# Patient Record
Sex: Female | Born: 1995 | Race: Asian | Marital: Married | Smoking: Never smoker
Health system: Southern US, Community
[De-identification: ages and names within clinical notes are randomized; demographics above are authoritative.]

## PROBLEM LIST (undated history)

## (undated) DIAGNOSIS — K5900 Constipation, unspecified: Secondary | ICD-10-CM

## (undated) DIAGNOSIS — M76892 Other specified enthesopathies of left lower limb, excluding foot: Secondary | ICD-10-CM

## (undated) DIAGNOSIS — T7840XA Allergy, unspecified, initial encounter: Secondary | ICD-10-CM

## (undated) HISTORY — DX: Allergy, unspecified, initial encounter: T78.40XA

## (undated) HISTORY — DX: Other specified enthesopathies of left lower limb, excluding foot: M76.892

## (undated) HISTORY — DX: Constipation, unspecified: K59.00

---

## 2015-06-30 NOTE — L&D Delivery Note (Signed)
20 y.o. G2P1001 at 3239w4d delivered by Dr. Freddrick MarchYashika Amin, a viable female infant in cephalic, LOA position. No nuchal cord. Right anterior shoulder delivered with ease. 60 sec delayed cord clamping. Cord clamped x2 and cut. Placenta delivered spontaneously intact, with 3VC. Fundus firm on exam with massage and pitocin. Good hemostasis noted.  Laceration: Deep 2nd degree perineal, EAS intact; left periurethral (when repaired, had a straight catheter in place to protect the urethra, patent urethra when finished with repair, straight foley catheter removed). Suture: 2-0 Vicryl, 3-0 Vicryl. 4-0 Monocryl Good hemostasis noted. EBL: 300 cc  Mom and baby recovering in LDR.    Apgars: 9/9 Weight: 2934g (6lb 7.5oz)    Jen MowElizabeth Mumaw, DO OB Fellow [facility], [facility] 06/25/2016, 5:48 PM

## 2015-11-06 ENCOUNTER — Ambulatory Visit (INDEPENDENT_AMBULATORY_CARE_PROVIDER_SITE_OTHER): Payer: Self-pay | Admitting: General Practice

## 2015-11-06 ENCOUNTER — Encounter: Payer: Self-pay | Admitting: Obstetrics and Gynecology

## 2015-11-06 DIAGNOSIS — Z3201 Encounter for pregnancy test, result positive: Secondary | ICD-10-CM

## 2015-11-06 LAB — POCT PREGNANCY, URINE: Preg Test, Ur: POSITIVE — AB

## 2015-11-06 NOTE — Progress Notes (Signed)
Patient here for upt today. upt +. Reports first positive home test last week. LMP 09/22/15 EDD 06/28/16. Encouraged PNV and provided new OB packet. Desires to start care here. Patient sent to front office to make new OB appt.

## 2015-12-11 ENCOUNTER — Ambulatory Visit (INDEPENDENT_AMBULATORY_CARE_PROVIDER_SITE_OTHER): Payer: Self-pay | Admitting: Family Medicine

## 2015-12-11 ENCOUNTER — Encounter: Payer: Self-pay | Admitting: Family Medicine

## 2015-12-11 VITALS — BP 121/70 | HR 67 | Ht 65.0 in | Wt 106.0 lb

## 2015-12-11 DIAGNOSIS — Z3491 Encounter for supervision of normal pregnancy, unspecified, first trimester: Secondary | ICD-10-CM

## 2015-12-11 DIAGNOSIS — Z3492 Encounter for supervision of normal pregnancy, unspecified, second trimester: Secondary | ICD-10-CM

## 2015-12-11 DIAGNOSIS — O2341 Unspecified infection of urinary tract in pregnancy, first trimester: Secondary | ICD-10-CM

## 2015-12-11 DIAGNOSIS — Z3490 Encounter for supervision of normal pregnancy, unspecified, unspecified trimester: Secondary | ICD-10-CM | POA: Insufficient documentation

## 2015-12-11 DIAGNOSIS — O2340 Unspecified infection of urinary tract in pregnancy, unspecified trimester: Secondary | ICD-10-CM | POA: Insufficient documentation

## 2015-12-11 LAB — POCT URINALYSIS DIP (DEVICE)
Bilirubin Urine: NEGATIVE
GLUCOSE, UA: NEGATIVE mg/dL
Hgb urine dipstick: NEGATIVE
Ketones, ur: NEGATIVE mg/dL
LEUKOCYTES UA: NEGATIVE
NITRITE: NEGATIVE
Protein, ur: NEGATIVE mg/dL
Specific Gravity, Urine: 1.015 (ref 1.005–1.030)
UROBILINOGEN UA: 0.2 mg/dL (ref 0.0–1.0)
pH: 7 (ref 5.0–8.0)

## 2015-12-11 NOTE — Progress Notes (Signed)
Subjective:  Erika Alvarez is a 20 y.o. G1P0 at 7787w3d being seen today for ongoing prenatal care.  She is currently monitored for the following issues for this low-risk pregnancy and has Supervision of low-risk pregnancy and UTI in pregnancy, antepartum on her problem list.  Patient reports no complaints.  Contractions: Not present. Vag. Bleeding: None.  Movement: Absent. Denies leaking of fluid.   The following portions of the patient's history were reviewed and updated as appropriate: allergies, current medications, past family history, past medical history, past social history, past surgical history and problem list. Problem list updated.  Objective:   Filed Vitals:   12/11/15 0944 12/11/15 1010  BP: 121/70   Pulse: 67   Height:  5\' 5"  (1.651 m)  Weight: 106 lb (48.081 kg)     Fetal Status:     Movement: Absent     General:  Alert, oriented and cooperative. Patient is in no acute distress.  Skin: Skin is warm and dry. No rash noted.   Cardiovascular: Normal heart rate noted  Respiratory: Normal respiratory effort, no problems with respiration noted  Abdomen: Soft, gravid, appropriate for gestational age. Pain/Pressure: Absent     Pelvic: Cervical exam deferred        Extremities: Normal range of motion.  Edema: None  Mental Status: Normal mood and affect. Normal behavior. Normal judgment and thought content.   Urinalysis:      Assessment and Plan:  Pregnancy: G1P0 at 3787w3d  1. Supervision of low-risk pregnancy, second trimester - added box - Prenatal Profile - Culture, OB Urine - GC/Chlamydia probe amp ([place] - [id number] PDM PROFILE  2. UTI in pregnancy, antepartum, first trimester Will finish keflex TOC after completion  Preterm labor symptoms and general obstetric precautions including but not limited to vaginal bleeding, contractions, leaking of fluid and fetal movement were reviewed in detail with the patient. Please refer to After Visit Summary for  other counseling recommendations.  Return in about 4 weeks (around 01/08/2016) for Routine prenatal care.   Federico FlakeKimberly Niles Haizlee Henton, MD

## 2015-12-11 NOTE — Patient Instructions (Signed)
First Trimester of Pregnancy The first trimester of pregnancy is from week 1 until the end of week 12 (months 1 through 3). A week after a sperm fertilizes an egg, the egg will implant on the wall of the uterus. This embryo will begin to develop into a baby. Genes from you and your partner are forming the baby. The female genes determine whether the baby is a boy or a girl. At 6-8 weeks, the eyes and face are formed, and the heartbeat can be seen on ultrasound. At the end of 12 weeks, all the baby's organs are formed.  Now that you are pregnant, you will want to do everything you can to have a healthy baby. Two of the most important things are to get good prenatal care and to follow your health care provider's instructions. Prenatal care is all the medical care you receive before the baby's birth. This care will help prevent, find, and treat any problems during the pregnancy and childbirth. BODY CHANGES Your body goes through many changes during pregnancy. The changes vary from woman to woman.   You may gain or lose a couple of pounds at first.  You may feel sick to your stomach (nauseous) and throw up (vomit). If the vomiting is uncontrollable, call your health care provider.  You may tire easily.  You may develop headaches that can be relieved by medicines approved by your health care provider.  You may urinate more often. Painful urination may mean you have a bladder infection.  You may develop heartburn as a result of your pregnancy.  You may develop constipation because certain hormones are causing the muscles that push waste through your intestines to slow down.  You may develop hemorrhoids or swollen, bulging veins (varicose veins).  Your breasts may begin to grow larger and become tender. Your nipples may stick out more, and the tissue that surrounds them (areola) may become darker.  Your gums may bleed and may be sensitive to brushing and flossing.  Dark spots or blotches (chloasma,  mask of pregnancy) may develop on your face. This will likely fade after the baby is born.  Your menstrual periods will stop.  You may have a loss of appetite.  You may develop cravings for certain kinds of food.  You may have changes in your emotions from day to day, such as being excited to be pregnant or being concerned that something may go wrong with the pregnancy and baby.  You may have more vivid and strange dreams.  You may have changes in your hair. These can include thickening of your hair, rapid growth, and changes in texture. Some women also have hair loss during or after pregnancy, or hair that feels dry or thin. Your hair will most likely return to normal after your baby is born. WHAT TO EXPECT AT YOUR PRENATAL VISITS During a routine prenatal visit:  You will be weighed to make sure you and the baby are growing normally.  Your blood pressure will be taken.  Your abdomen will be measured to track your baby's growth.  The fetal heartbeat will be listened to starting around week 10 or 12 of your pregnancy.  Test results from any previous visits will be discussed. Your health care provider may ask you:  How you are feeling.  If you are feeling the baby move.  If you have had any abnormal symptoms, such as leaking fluid, bleeding, severe headaches, or abdominal cramping.  If you are using any tobacco products,   including cigarettes, chewing tobacco, and electronic cigarettes.  If you have any questions. Other tests that may be performed during your first trimester include:  Blood tests to find your blood type and to check for the presence of any previous infections. They will also be used to check for low iron levels (anemia) and Rh antibodies. Later in the pregnancy, blood tests for diabetes will be done along with other tests if problems develop.  Urine tests to check for infections, diabetes, or protein in the urine.  An ultrasound to confirm the proper growth  and development of the baby.  An amniocentesis to check for possible genetic problems.  Fetal screens for spina bifida and Down syndrome.  You may need other tests to make sure you and the baby are doing well.  HIV (human immunodeficiency virus) testing. Routine prenatal testing includes screening for HIV, unless you choose not to have this test. HOME CARE INSTRUCTIONS  Medicines  Follow your health care provider's instructions regarding medicine use. Specific medicines may be either safe or unsafe to take during pregnancy.  Take your prenatal vitamins as directed.  If you develop constipation, try taking a stool softener if your health care provider approves. Diet  Eat regular, well-balanced meals. Choose a variety of foods, such as meat or vegetable-based protein, fish, milk and low-fat dairy products, vegetables, fruits, and whole grain breads and cereals. Your health care provider will help you determine the amount of weight gain that is right for you.  Avoid raw meat and uncooked cheese. These carry germs that can cause birth defects in the baby.  Eating four or five small meals rather than three large meals a day may help relieve nausea and vomiting. If you start to feel nauseous, eating a few soda crackers can be helpful. Drinking liquids between meals instead of during meals also seems to help nausea and vomiting.  If you develop constipation, eat more high-fiber foods, such as fresh vegetables or fruit and whole grains. Drink enough fluids to keep your urine clear or pale yellow. Activity and Exercise  Exercise only as directed by your health care provider. Exercising will help you:  Control your weight.  Stay in shape.  Be prepared for labor and delivery.  Experiencing pain or cramping in the lower abdomen or low back is a good sign that you should stop exercising. Check with your health care provider before continuing normal exercises.  Try to avoid standing for long  periods of time. Move your legs often if you must stand in one place for a long time.  Avoid heavy lifting.  Wear low-heeled shoes, and practice good posture.  You may continue to have sex unless your health care provider directs you otherwise. Relief of Pain or Discomfort  Wear a good support bra for breast tenderness.   Take warm sitz baths to soothe any pain or discomfort caused by hemorrhoids. Use hemorrhoid cream if your health care provider approves.   Rest with your legs elevated if you have leg cramps or low back pain.  If you develop varicose veins in your legs, wear support hose. Elevate your feet for 15 minutes, 3-4 times a day. Limit salt in your diet. Prenatal Care  Schedule your prenatal visits by the twelfth week of pregnancy. They are usually scheduled monthly at first, then more often in the last 2 months before delivery.  Write down your questions. Take them to your prenatal visits.  Keep all your prenatal visits as directed by your   health care provider. Safety  Wear your seat belt at all times when driving.  Make a list of emergency phone numbers, including numbers for family, friends, the hospital, and police and fire departments. General Tips  Ask your health care provider for a referral to a local prenatal education class. Begin classes no later than at the beginning of month 6 of your pregnancy.  Ask for help if you have counseling or nutritional needs during pregnancy. Your health care provider can offer advice or refer you to specialists for help with various needs.  Do not use hot tubs, steam rooms, or saunas.  Do not douche or use tampons or scented sanitary pads.  Do not cross your legs for long periods of time.  Avoid cat litter boxes and soil used by cats. These carry germs that can cause birth defects in the baby and possibly loss of the fetus by miscarriage or stillbirth.  Avoid all smoking, herbs, alcohol, and medicines not prescribed by  your health care provider. Chemicals in these affect the formation and growth of the baby.  Do not use any tobacco products, including cigarettes, chewing tobacco, and electronic cigarettes. If you need help quitting, ask your health care provider. You may receive counseling support and other resources to help you quit.  Schedule a dentist appointment. At home, brush your teeth with a soft toothbrush and be gentle when you floss. SEEK MEDICAL CARE IF:   You have dizziness.  You have mild pelvic cramps, pelvic pressure, or nagging pain in the abdominal area.  You have persistent nausea, vomiting, or diarrhea.  You have a bad smelling vaginal discharge.  You have pain with urination.  You notice increased swelling in your face, hands, legs, or ankles. SEEK IMMEDIATE MEDICAL CARE IF:   You have a fever.  You are leaking fluid from your vagina.  You have spotting or bleeding from your vagina.  You have severe abdominal cramping or pain.  You have rapid weight gain or loss.  You vomit blood or material that looks like coffee grounds.  You are exposed to German measles and have never had them.  You are exposed to fifth disease or chickenpox.  You develop a severe headache.  You have shortness of breath.  You have any kind of trauma, such as from a fall or a car accident.   This information is not intended to replace advice given to you by your health care provider. Make sure you discuss any questions you have with your health care provider.   Document Released: 06/09/2001 Document Revised: 07/06/2014 Document Reviewed: 04/25/2013 Elsevier Interactive Patient Education 2016 Elsevier Inc.  

## 2015-12-11 NOTE — Progress Notes (Signed)
Video interpreters used for encounter Greig Castilla- Andrew # [phone] and Mai # [phone].  [facility], also treated for UTI.

## 2015-12-12 LAB — PRENATAL PROFILE (SOLSTAS)
Antibody Screen: NEGATIVE
BASOS ABS: 0 {cells}/uL (ref 0–200)
Basophils Relative: 0 %
Eosinophils Absolute: 192 cells/uL (ref 15–500)
Eosinophils Relative: 3 %
HEMATOCRIT: 38.2 % (ref 35.0–45.0)
HEP B S AG: NEGATIVE
HIV: NONREACTIVE
Hemoglobin: 12.4 g/dL (ref 11.7–15.5)
LYMPHS ABS: 1408 {cells}/uL (ref 850–3900)
Lymphocytes Relative: 22 %
MCH: 29.2 pg (ref 27.0–33.0)
MCHC: 32.5 g/dL (ref 32.0–36.0)
MCV: 90.1 fL (ref 80.0–100.0)
MONO ABS: 256 {cells}/uL (ref 200–950)
MPV: 10.7 fL (ref 7.5–12.5)
Monocytes Relative: 4 %
NEUTROS ABS: 4544 {cells}/uL (ref 1500–7800)
NEUTROS PCT: 71 %
Platelets: 175 10*3/uL (ref 140–400)
RBC: 4.24 MIL/uL (ref 3.80–5.10)
RDW: 14.5 % (ref 11.0–15.0)
RUBELLA: 3.05 {index} — AB (ref <0.90)
Rh Type: POSITIVE
WBC: 6.4 10*3/uL (ref 3.8–10.8)

## 2015-12-12 LAB — GC/CHLAMYDIA PROBE AMP (~~LOC~~) NOT AT ARMC
Chlamydia: NEGATIVE
NEISSERIA GONORRHEA: NEGATIVE

## 2015-12-18 ENCOUNTER — Encounter: Payer: Self-pay | Admitting: Obstetrics and Gynecology

## 2015-12-18 DIAGNOSIS — IMO0002 Reserved for concepts with insufficient information to code with codable children: Secondary | ICD-10-CM | POA: Insufficient documentation

## 2016-01-16 ENCOUNTER — Ambulatory Visit (INDEPENDENT_AMBULATORY_CARE_PROVIDER_SITE_OTHER): Payer: Self-pay | Admitting: Family

## 2016-01-16 ENCOUNTER — Encounter (HOSPITAL_COMMUNITY): Payer: Self-pay | Admitting: Family Medicine

## 2016-01-16 VITALS — BP 117/63 | HR 86 | Wt 110.4 lb

## 2016-01-16 DIAGNOSIS — Z3492 Encounter for supervision of normal pregnancy, unspecified, second trimester: Secondary | ICD-10-CM

## 2016-01-16 LAB — POCT URINALYSIS DIP (DEVICE)
Bilirubin Urine: NEGATIVE
GLUCOSE, UA: NEGATIVE mg/dL
HGB URINE DIPSTICK: NEGATIVE
KETONES UR: NEGATIVE mg/dL
LEUKOCYTES UA: NEGATIVE
Nitrite: NEGATIVE
PROTEIN: NEGATIVE mg/dL
SPECIFIC GRAVITY, URINE: 1.025 (ref 1.005–1.030)
Urobilinogen, UA: 0.2 mg/dL (ref 0.0–1.0)
pH: 6.5 (ref 5.0–8.0)

## 2016-01-16 NOTE — Progress Notes (Signed)
[place] interpreter Angie [phone] used

## 2016-01-16 NOTE — Progress Notes (Signed)
Subjective:  Erika Alvarez is a 20 y.o. G1P0 at 386w4d being seen today for ongoing prenatal care.  She is currently monitored for the following issues for this low-risk pregnancy and has Supervision of low-risk pregnancy; UTI in pregnancy, antepartum; and Teen pregnancy on her problem list.  Patient reports no complaints.  Contractions: Not present. Vag. Bleeding: None.  Movement: Present. Denies leaking of fluid.   The following portions of the patient's history were reviewed and updated as appropriate: allergies, current medications, past family history, past medical history, past social history, past surgical history and problem list. Problem list updated.  Objective:   Filed Vitals:   01/16/16 1248  BP: 117/63  Pulse: 86  Weight: 110 lb 6.4 oz (50.077 kg)    Fetal Status: Fetal Heart Rate (bpm): 150 Fundal Height: 17 cm Movement: Present     General:  Alert, oriented and cooperative. Patient is in no acute distress.  Skin: Skin is warm and dry. No rash noted.   Cardiovascular: Normal heart rate noted  Respiratory: Normal respiratory effort, no problems with respiration noted  Abdomen: Soft, gravid, appropriate for gestational age. Pain/Pressure: Present     Pelvic:  Cervical exam deferred        Extremities: Normal range of motion.  Edema: None  Mental Status: Normal mood and affect. Normal behavior. Normal judgment and thought content.   Urinalysis:     Protein negative  Glucose negative  Assessment and Plan:  Pregnancy: G1P0 at 326w4d  1. Supervision of low-risk pregnancy, second trimester - Reviewed lab results - Anatomy ultrasound scheduled for 01/23/16  Preterm labor symptoms and general obstetric precautions including but not limited to vaginal bleeding, contractions, leaking of fluid and fetal movement were reviewed in detail with the patient. Please refer to After Visit Summary for other counseling recommendations.  Return in about 4 weeks (around  02/13/2016).   Eino FarberWalidah Kennith GainN Karim, CNM

## 2016-01-23 ENCOUNTER — Ambulatory Visit (HOSPITAL_COMMUNITY)
Admission: RE | Admit: 2016-01-23 | Discharge: 2016-01-23 | Disposition: A | Discharge status: Home / Self Care | Payer: Medicaid Other | Source: Ambulatory Visit | Attending: Family Medicine | Admitting: Family Medicine

## 2016-01-23 ENCOUNTER — Other Ambulatory Visit: Payer: Self-pay | Admitting: Family Medicine

## 2016-01-23 ENCOUNTER — Other Ambulatory Visit (HOSPITAL_COMMUNITY): Payer: Self-pay | Admitting: Obstetrics and Gynecology

## 2016-01-23 DIAGNOSIS — Z1389 Encounter for screening for other disorder: Secondary | ICD-10-CM

## 2016-01-23 DIAGNOSIS — O350XX0 Maternal care for (suspected) central nervous system malformation in fetus, not applicable or unspecified: Secondary | ICD-10-CM

## 2016-01-23 DIAGNOSIS — Z363 Encounter for antenatal screening for malformations: Secondary | ICD-10-CM

## 2016-01-23 DIAGNOSIS — O283 Abnormal ultrasonic finding on antenatal screening of mother: Secondary | ICD-10-CM | POA: Insufficient documentation

## 2016-01-23 DIAGNOSIS — Z3492 Encounter for supervision of normal pregnancy, unspecified, second trimester: Secondary | ICD-10-CM

## 2016-01-23 DIAGNOSIS — IMO0002 Reserved for concepts with insufficient information to code with codable children: Secondary | ICD-10-CM | POA: Insufficient documentation

## 2016-01-23 DIAGNOSIS — O359XX0 Maternal care for (suspected) fetal abnormality and damage, unspecified, not applicable or unspecified: Secondary | ICD-10-CM

## 2016-01-23 DIAGNOSIS — Z3A17 17 weeks gestation of pregnancy: Secondary | ICD-10-CM | POA: Diagnosis not present

## 2016-01-23 DIAGNOSIS — Z315 Encounter for genetic counseling: Secondary | ICD-10-CM | POA: Diagnosis present

## 2016-01-23 NOTE — Progress Notes (Signed)
Genetic Counseling  High-Risk Gestation Note  Appointment Date:  01/23/2016 Referred By: Federico Flake,* Date of Birth:  June 05, 1996   Pregnancy History: G1P0 Estimated Date of Delivery: 06/28/16 Estimated Gestational Age: [redacted]w[redacted]d Attending: Eulis Foster, MD   [name] and her partner were seen for genetic counseling because of abnormal ultrasound findings today.  Vietnamese/English [employer] interpreter, Vivi Barrack [phone]), provided interpretation for today's visit.   In summary:  Reviewed ultrasound findings and the association with fetal aneuploidy  Choroid plexus cyst  Echogenic intracardiac focus  Given patient's age, these findings are expected to confer an overall low risk for fetal aneuploidy for the pregnancy  Discussed screening for fetal aneuploidy  Quad screen- declined  NIPS- declined  Discussed option of diagnostic testing  Amniocentesis- declined  Follow-up ultrasound is scheduled for 02/20/16  Ultrasound revealed an echogenic intracardiac focus (EIF) and choroid plexus cysts (CPC).  The ultrasound report will be sent under separate cover.  We discussed that the second trimester genetic sonogram is targeted at identifying features associated with aneuploidy.  It has evolved as a screening tool used to provide an individualized risk assessment for Down syndrome and other trisomies.  The ability of sonography to aid in the detection of aneuploidies relies on identification of both major structural anomalies and "soft markers."  The patient was counseled that the latter term refers to findings that are often normal variants and do not cause any significant medical problems.  Nonetheless, these markers have a known association with aneuploidy.    The patient was counseled that an EIF is characterized by calcified papillary muscle leading to a discreet dot in the left, or less commonly, the right ventricle.  We discussed that this finding is typically  considered to be a benign variant; however, the risk of aneuploidy is increased when this marker is found in patients who have additional risk factors for fetal aneuploidy (AMA, abnormal screening test, or other markers or anomalies by fetal ultrasound).   Ms. Erika Alvarez was counseled that the choroid plexus is an area in the brain where cerebral spinal fluid, the fluid that bathes the brain and spinal cord, is made.  Cysts, or fluid filled sacs, are sometimes found in the choroid plexus of babies both before and after they are born.  We discussed that approximately 1% of pregnancies evaluated by ultrasound will show choroid plexus cyst (CPCs).  Literature suggests that CPCs are an ultrasound finding in approximately 30-50% of fetuses with trisomy 18, but are an isolated finding in less than 10% of fetuses with trisomy 20.  They were counseled that when a patient has other risk factors for fetal trisomy 33 (abnormal First trimester or quad screening, advanced maternal age, or another ultrasound finding), CPCs are associated with an increased risk (LR of 9) for trisomy 63.  Newer literature suggests that in the absence of other risk factors, CPCs are likely a normal variation of development or a benign finding.  CPCs are not associated with an increased risk for fetal Down syndrome.  We reviewed chromosomes, nondisjunction, and the common features and variable prognosis of Down syndrome and poor prognosis of trisomy 18.  Considering her maternal age of 20 y.o. and her otherwise normal fetal anatomy ultrasound, Ms. Erika Alvarez's risk for fetal trisomy 34 is not expected to be significantly increased above her age related risk, and risk for fetal Down syndrome is expected to be less than 1 in 600.  However, additional screening and diagnostic testing options  for detection of fetal aneuploidy were discussed.     We reviewed other available screening options including Quad screen and noninvasive prenatal screening  (NIPS)/cell free DNA (cfDNA) screening. We discussed that a screening test adjusts the a priori risk to provide a pregnancy specific risk assessment. We reviewed the sensitivities and false positive rates of each screen as well as the methodology of each. We discussed the diagnostic testing option of amniocentesis including the associated 1 in 300-500 risk for complications, including spontaneous pregnancy loss.  She was counseled regarding the benefits and limitations of each option.  We discussed the possible results that the tests might provide including: positive, negative, unanticipated, and no result. Finally, they were counseled regarding the cost of each option and potential out of pocket expenses.   After consideration of all the options, they declined Quad screen, NIPS, and amniocentesis. She elected to return for ultrasound only, which is scheduled for 02/20/16.   Family and pregnancy histories were not reviewed today given the add-on nature and time constraints of today's visit.   I counseled this couple regarding the above risks and available options.  The approximate face-to-face time with the genetic counselor was 40 minutes.   Quinn Plowman, MS Certified Genetic Counselor 01/23/2016

## 2016-02-17 ENCOUNTER — Ambulatory Visit (INDEPENDENT_AMBULATORY_CARE_PROVIDER_SITE_OTHER): Payer: Medicaid Other | Admitting: Obstetrics and Gynecology

## 2016-02-17 VITALS — BP 116/71 | HR 88 | Wt 118.8 lb

## 2016-02-17 DIAGNOSIS — Z3492 Encounter for supervision of normal pregnancy, unspecified, second trimester: Secondary | ICD-10-CM

## 2016-02-17 DIAGNOSIS — O283 Abnormal ultrasonic finding on antenatal screening of mother: Secondary | ICD-10-CM

## 2016-02-17 DIAGNOSIS — O350XX0 Maternal care for (suspected) central nervous system malformation in fetus, not applicable or unspecified: Secondary | ICD-10-CM

## 2016-02-17 DIAGNOSIS — IMO0002 Reserved for concepts with insufficient information to code with codable children: Secondary | ICD-10-CM

## 2016-02-17 LAB — POCT URINALYSIS DIP (DEVICE)
BILIRUBIN URINE: NEGATIVE
GLUCOSE, UA: NEGATIVE mg/dL
Hgb urine dipstick: NEGATIVE
KETONES UR: NEGATIVE mg/dL
Nitrite: NEGATIVE
PROTEIN: NEGATIVE mg/dL
SPECIFIC GRAVITY, URINE: 1.02 (ref 1.005–1.030)
Urobilinogen, UA: 0.2 mg/dL (ref 0.0–1.0)
pH: 6 (ref 5.0–8.0)

## 2016-02-17 NOTE — Progress Notes (Signed)
Used [employer] interpreter [phone]#[id number].

## 2016-02-17 NOTE — Progress Notes (Signed)
Subjective:  Erika Alvarez is a 20 y.o. G1P0 at [redacted]w[redacted]d being seen today for ongoing prenatal care.  She is currently monitored for the following issues for this low-risk pregnancy and has Supervision of low-risk pregnancy; Teen pregnancy; Choroid plexus cyst of fetus on prenatal ultrasound; and Echogenic intracardiac focus of fetus on prenatal ultrasound on her problem list.  Patient reports no complaints.  Contractions: Not present. Vag. Bleeding: None.  Movement: Present. Denies leaking of fluid.   The following portions of the patient's history were reviewed and updated as appropriate: allergies, current medications, past family history, past medical history, past social history, past surgical history and problem list. Problem list updated.  Objective:   Vitals:   02/17/16 1256  BP: 116/71  Pulse: 88  Weight: 118 lb 12.8 oz (53.9 kg)    Fetal Status: Fetal Heart Rate (bpm): 154   Movement: Present     General:  Alert, oriented and cooperative. Patient is in no acute distress.  Skin: Skin is warm and dry. No rash noted.   Cardiovascular: Normal heart rate noted  Respiratory: Normal respiratory effort, no problems with respiration noted  Abdomen: Soft, gravid, appropriate for gestational age. Pain/Pressure: Present     Pelvic:  Cervical exam deferred        Extremities: Normal range of motion.  Edema: None  Mental Status: Normal mood and affect. Normal behavior. Normal judgment and thought content.   Urinalysis:      Assessment and Plan:  Pregnancy: G1P0 at [redacted]w[redacted]d  1. Supervision of low-risk pregnancy, second trimester  - Culture, OB Urine  2. Teen pregnancy   3. Choroid plexus cyst of fetus on prenatal ultrasound  Has F/U U/S this week  4. Echogenic intracardiac focus of fetus on prenatal ultrasound  Has F/U U/S this week  Preterm labor symptoms and general obstetric precautions including but not limited to vaginal bleeding, contractions, leaking of fluid and fetal  movement were reviewed in detail with the patient. Please refer to After Visit Summary for other counseling recommendations.  Return in about 4 weeks (around 03/16/2016) for OB visit.   Hermina StaggersMichael L Ervin, MD

## 2016-02-19 LAB — CULTURE, OB URINE
Colony Count: NO GROWTH
Organism ID, Bacteria: NO GROWTH

## 2016-02-20 ENCOUNTER — Encounter (HOSPITAL_COMMUNITY): Payer: Self-pay

## 2016-02-20 ENCOUNTER — Ambulatory Visit (HOSPITAL_COMMUNITY)
Admission: RE | Admit: 2016-02-20 | Discharge: 2016-02-20 | Disposition: A | Discharge status: Home / Self Care | Payer: Medicaid Other | Source: Ambulatory Visit | Attending: Family Medicine | Admitting: Family Medicine

## 2016-02-20 DIAGNOSIS — Z3A21 21 weeks gestation of pregnancy: Secondary | ICD-10-CM | POA: Insufficient documentation

## 2016-02-20 DIAGNOSIS — O350XX0 Maternal care for (suspected) central nervous system malformation in fetus, not applicable or unspecified: Secondary | ICD-10-CM

## 2016-02-20 DIAGNOSIS — O358XX0 Maternal care for other (suspected) fetal abnormality and damage, not applicable or unspecified: Secondary | ICD-10-CM | POA: Diagnosis not present

## 2016-02-20 DIAGNOSIS — O283 Abnormal ultrasonic finding on antenatal screening of mother: Secondary | ICD-10-CM

## 2016-02-20 DIAGNOSIS — IMO0002 Reserved for concepts with insufficient information to code with codable children: Secondary | ICD-10-CM

## 2016-03-16 ENCOUNTER — Ambulatory Visit (INDEPENDENT_AMBULATORY_CARE_PROVIDER_SITE_OTHER): Payer: Medicaid Other | Admitting: Obstetrics and Gynecology

## 2016-03-16 ENCOUNTER — Encounter: Payer: Self-pay | Admitting: Obstetrics and Gynecology

## 2016-03-16 VITALS — BP 125/66 | HR 88 | Wt 123.2 lb

## 2016-03-16 DIAGNOSIS — IMO0002 Reserved for concepts with insufficient information to code with codable children: Secondary | ICD-10-CM

## 2016-03-16 DIAGNOSIS — Z3492 Encounter for supervision of normal pregnancy, unspecified, second trimester: Secondary | ICD-10-CM | POA: Diagnosis not present

## 2016-03-16 DIAGNOSIS — Z23 Encounter for immunization: Secondary | ICD-10-CM

## 2016-03-16 DIAGNOSIS — O283 Abnormal ultrasonic finding on antenatal screening of mother: Secondary | ICD-10-CM | POA: Diagnosis not present

## 2016-03-16 LAB — POCT URINALYSIS DIP (DEVICE)
Bilirubin Urine: NEGATIVE
Glucose, UA: NEGATIVE mg/dL
HGB URINE DIPSTICK: NEGATIVE
Ketones, ur: NEGATIVE mg/dL
Leukocytes, UA: NEGATIVE
NITRITE: NEGATIVE
PH: 7 (ref 5.0–8.0)
Protein, ur: NEGATIVE mg/dL
Specific Gravity, Urine: 1.015 (ref 1.005–1.030)
UROBILINOGEN UA: 0.2 mg/dL (ref 0.0–1.0)

## 2016-03-16 NOTE — Progress Notes (Signed)
Used Video interpreter [employer] [id number].

## 2016-03-16 NOTE — Addendum Note (Signed)
Addended by: Gerome ApleyZEYFANG, LINDA L on: 03/16/2016 04:21 PM   Modules accepted: Orders

## 2016-03-16 NOTE — Progress Notes (Signed)
Subjective:  Erika Alvarez is a 20 y.o. G1P0 at 4370w1d being seen today for ongoing prenatal care.  She is currently monitored for the following issues for this low-risk pregnancy and has Supervision of low-risk pregnancy; Teen pregnancy; and Echogenic intracardiac focus of fetus on prenatal ultrasound on her problem list.  Patient reports no complaints.  Contractions: Not present. Vag. Bleeding: None.  Movement: Present. Denies leaking of fluid.   The following portions of the patient's history were reviewed and updated as appropriate: allergies, current medications, past family history, past medical history, past social history, past surgical history and problem list. Problem list updated.  Objective:   Vitals:   03/16/16 1528  BP: 125/66  Pulse: 88  Weight: 123 lb 3.2 oz (55.9 kg)    Fetal Status: Fetal Heart Rate (bpm): 150   Movement: Present     General:  Alert, oriented and cooperative. Patient is in no acute distress.  Skin: Skin is warm and dry. No rash noted.   Cardiovascular: Normal heart rate noted  Respiratory: Normal respiratory effort, no problems with respiration noted  Abdomen: Soft, gravid, appropriate for gestational age. Pain/Pressure: Absent     Pelvic:  Cervical exam deferred        Extremities: Normal range of motion.  Edema: None  Mental Status: Normal mood and affect. Normal behavior. Normal judgment and thought content.   Urinalysis: Urine Protein: Negative Urine Glucose: Negative  Assessment and Plan:  Pregnancy: G1P0 at 5770w1d  1. Supervision of low-risk pregnancy, second trimester Flu vaccine  2. Teen pregnancy   3. Echogenic intracardiac focus of fetus on prenatal ultrasound Stable on last U/S. No follow recommended by MFM at last U/S.  Preterm labor symptoms and general obstetric precautions including but not limited to vaginal bleeding, contractions, leaking of fluid and fetal movement were reviewed in detail with the patient. Please refer to  After Visit Summary for other counseling recommendations.  Return in about 3 weeks (around 04/06/2016) for OB visit.   Hermina StaggersMichael L Scotland Dost, MD

## 2016-04-07 ENCOUNTER — Ambulatory Visit (INDEPENDENT_AMBULATORY_CARE_PROVIDER_SITE_OTHER): Payer: Medicaid Other | Admitting: Obstetrics & Gynecology

## 2016-04-07 ENCOUNTER — Encounter: Payer: Self-pay | Admitting: Obstetrics & Gynecology

## 2016-04-07 VITALS — BP 121/72 | HR 94 | Wt 127.0 lb

## 2016-04-07 DIAGNOSIS — Z3493 Encounter for supervision of normal pregnancy, unspecified, third trimester: Secondary | ICD-10-CM | POA: Diagnosis not present

## 2016-04-07 DIAGNOSIS — Z23 Encounter for immunization: Secondary | ICD-10-CM

## 2016-04-07 LAB — CBC
HEMATOCRIT: 34.8 % — AB (ref 35.0–45.0)
HEMOGLOBIN: 11.4 g/dL — AB (ref 11.7–15.5)
MCH: 29.8 pg (ref 27.0–33.0)
MCHC: 32.8 g/dL (ref 32.0–36.0)
MCV: 91.1 fL (ref 80.0–100.0)
MPV: 11.1 fL (ref 7.5–12.5)
Platelets: 200 10*3/uL (ref 140–400)
RBC: 3.82 MIL/uL (ref 3.80–5.10)
RDW: 13.9 % (ref 11.0–15.0)
WBC: 8 10*3/uL (ref 3.8–10.8)

## 2016-04-07 MED ORDER — TETANUS-DIPHTH-ACELL PERTUSSIS 5-2.5-18.5 LF-MCG/0.5 IM SUSP
0.5000 mL | Freq: Once | INTRAMUSCULAR | Status: AC
  Administered 2016-04-07: 0.5 mL via INTRAMUSCULAR

## 2016-04-07 NOTE — Progress Notes (Signed)
1 hour gtt due at 2:04 pm  

## 2016-04-07 NOTE — Patient Instructions (Addendum)
Return to clinic for any scheduled appointments or obstetric concerns, or go to [facility] for evaluation  

## 2016-04-07 NOTE — Progress Notes (Signed)
   PRENATAL VISIT NOTE  Subjective:  Erika Alvarez is a 20 y.o. G2P0 at 3649w2d being seen today for ongoing prenatal care.  She is currently monitored for the following issues for this low-risk pregnancy and has Supervision of low-risk pregnancy; Teen pregnancy; and Echogenic intracardiac focus of fetus on prenatal ultrasound on her problem list.  Patient reports no complaints.   .  .  Movement: Present. Denies leaking of fluid.   The following portions of the patient's history were reviewed and updated as appropriate: allergies, current medications, past family history, past medical history, past social history, past surgical history and problem list. Problem list updated.  Objective:   Vitals:   04/07/16 1259  BP: 121/72  Pulse: 94  Weight: 127 lb (57.6 kg)    Fetal Status: Fetal Heart Rate (bpm): 148 Fundal Height: 28 cm Movement: Present     General:  Alert, oriented and cooperative. Patient is in no acute distress.  Skin: Skin is warm and dry. No rash noted.   Cardiovascular: Normal heart rate noted  Respiratory: Normal respiratory effort, no problems with respiration noted  Abdomen: Soft, gravid, appropriate for gestational age. Pain/Pressure: Present     Pelvic:  Cervical exam deferred        Extremities: Normal range of motion.  Edema: None  Mental Status: Normal mood and affect. Normal behavior. Normal judgment and thought content.   Urinalysis:      Assessment and Plan:  Pregnancy: G2P0 at 4649w2d  1. Need for Tdap vaccination - Tdap vaccine greater than or equal to 7yo IM given  2. Encounter for supervision of low-risk pregnancy in third trimester Third trimester labs today - Glucose Tolerance, 1 HR (50g) - CBC - HIV antibody - RPR Preterm labor symptoms and general obstetric precautions including but not limited to vaginal bleeding, contractions, leaking of fluid and fetal movement were reviewed in detail with the patient. Please refer to After Visit Summary for  other counseling recommendations.  Return in about 2 weeks (around 04/21/2016) for OB Visit.  Tereso NewcomerUgonna A Khani Paino, MD

## 2016-04-07 NOTE — Addendum Note (Signed)
Addended by: Jill SideAY, DIANE L on: 04/07/2016 02:09 PM   Modules accepted: Orders

## 2016-04-08 LAB — GLUCOSE TOLERANCE, 1 HOUR (50G) W/O FASTING: Glucose, 1 Hr, gestational: 72 mg/dL (ref <140)

## 2016-04-08 LAB — HIV ANTIBODY (ROUTINE TESTING W REFLEX): HIV 1&2 Ab, 4th Generation: NONREACTIVE

## 2016-04-08 LAB — RPR

## 2016-04-29 ENCOUNTER — Ambulatory Visit (INDEPENDENT_AMBULATORY_CARE_PROVIDER_SITE_OTHER): Payer: Medicaid Other | Admitting: Obstetrics & Gynecology

## 2016-04-29 VITALS — BP 117/71 | HR 89 | Wt 133.0 lb

## 2016-04-29 DIAGNOSIS — Z3493 Encounter for supervision of normal pregnancy, unspecified, third trimester: Secondary | ICD-10-CM

## 2016-04-29 NOTE — Progress Notes (Signed)
   PRENATAL VISIT NOTE  Subjective:  Erika Alvarez is a 20 y.o. G2P0 at 8767w3d being seen today for ongoing prenatal care.  She is currently monitored for the following issues for this low-risk pregnancy and has Supervision of low-risk pregnancy; Teen pregnancy; and Echogenic intracardiac focus of fetus on prenatal ultrasound on her problem list. [place] interpreter used during encounter.  Patient reports no complaints.  Contractions: Irritability. Vag. Bleeding: None.  Movement: Present. Denies leaking of fluid.   The following portions of the patient's history were reviewed and updated as appropriate: allergies, current medications, past family history, past medical history, past social history, past surgical history and problem list. Problem list updated.  Objective:   Vitals:   04/29/16 1336  BP: 117/71  Pulse: 89  Weight: 133 lb (60.3 kg)    Fetal Status: Fetal Heart Rate (bpm): 150 Fundal Height: 30 cm Movement: Present     General:  Alert, oriented and cooperative. Patient is in no acute distress.  Skin: Skin is warm and dry. No rash noted.   Cardiovascular: Normal heart rate noted  Respiratory: Normal respiratory effort, no problems with respiration noted  Abdomen: Soft, gravid, appropriate for gestational age. Pain/Pressure: Absent     Pelvic:  Cervical exam deferred        Extremities: Normal range of motion.  Edema: None  Mental Status: Normal mood and affect. Normal behavior. Normal judgment and thought content.   Assessment and Plan:  Pregnancy: G2P0 at 6467w3d  1. Encounter for supervision of low-risk pregnancy in third trimester Preterm labor symptoms and general obstetric precautions including but not limited to vaginal bleeding, contractions, leaking of fluid and fetal movement were reviewed in detail with the patient. Please refer to After Visit Summary for other counseling recommendations.  Return in about 2 weeks (around 05/13/2016) for OB Visit.  Tereso NewcomerUgonna A  Syrena Burges, MD

## 2016-04-29 NOTE — Patient Instructions (Signed)
Return to clinic for any scheduled appointments or obstetric concerns, or go to [facility] for evaluation  

## 2016-05-13 ENCOUNTER — Ambulatory Visit (INDEPENDENT_AMBULATORY_CARE_PROVIDER_SITE_OTHER): Payer: Medicaid Other | Admitting: Obstetrics & Gynecology

## 2016-05-13 VITALS — BP 122/69 | HR 91 | Wt 135.5 lb

## 2016-05-13 DIAGNOSIS — Z3493 Encounter for supervision of normal pregnancy, unspecified, third trimester: Secondary | ICD-10-CM

## 2016-05-13 NOTE — Progress Notes (Signed)
Used Video interpreter Angie [id number]. C/o contractions 5 or 6 contractions in a day spread out throughout the day.

## 2016-05-13 NOTE — Patient Instructions (Signed)
Ba thng th? th? ba c?a thai k? (Third Trimester of Pregnancy) Ba thng th? ba l t? tu?n 29 ??n tu?n 42, thng th? 7 ??n thng th? 9. Ba thng th? ba c?ng l th?i gian khi bo thai ?ang pht tri?n nhanh chng. Vo cu?i thng th? chn, bo thai di kho?ng 20 inch v n?ng kho?ng 6-10 pao. C? TH? THAY ??I C? th? c?a qu v? tr?i qua r?t nhi?u thay ??i trong th?i gian mang thai. Nh?ng thay ??i khc nhau ? cc ph? n? khc nhau.  Cn n?ng c?a qu v? s? ti?p t?c t?ng. Qu v? c th? t?ng ???c 25-35 pao (11 - 16 kg) vo cu?i thai k?.  Qu v? c th? b?t ??u c cc v?t n?t trn hng, b?ng v ng?c.  Qu v? c th? ?i ti?u th??ng xuyn h?n v bo thai di chuy?n xu?ng th?p h?n vo khung ch?u c?a qu v? v ? vo bng quang c?a qu v?.  Qu v? c th? b? ho?c ti?p t?c b? ? nng do vi?c mang thai.  Qu v? c th? b? to bn v m?t s? hoc mn nh?t ??nh ?ang lm cc c? c ch?c n?ng ??y ch?t th?i qua ???ng ru?t ho?t ??ng ch?m l?i.  Qu v? c th? b? b?nh tr? ho?c s?ng, ph?ng t?nh m?ch (gin t?nh m?ch).  Qu v? c th? ?au khung ch?u v t?ng cn v cc hoc mn mang thai lm l?ng kh?p n?i gi?a cc x??ng ? khung ch?u c?a qu v?. ?au l?ng c th? do s? c? g?ng qu s?c c?a c? b?p h? tr? t? th? c?a qu v?.  Qu v? c nh?ng thay ??i v? tc. Nh?ng thay ??i ny c th? bao g?m tc dy ln, tc m?c nhanh h?n v thay ??i v? k?t c?u. M?t s? ph? n? c?ng b? r?ng tc trong khi ho?c sau khi mang thai, ho?c tc c?m th?y kh ho?c m?ng. Tc c?a qu v? s? c nhi?u kh? n?ng s? tr? l?i bnh th??ng sau khi em b ???c sinh ra.  Ng?c qu v? s? ti?p t?c pht tri?n v nh?y c?m ?au. Ch?t d?ch mu vng c th? r? t? v c?a qu v? g?i l s?a non.  R?n c?a qu v? c th? l?i ra.  Qu v? c th? c?m th?y kh th? v t? cung c?a qu v? to ra.  Qu v? c th? nh?n th?y thai nhi "r?i xu?ng", ho?c di chuy?n xu?ng th?p h?n trong b?ng c?a qu v?.  Qu v? c th? b? ti?t d?ch nh?y c mu. ?i?u ny th??ng x?y ra m?t vi ngy ??n m?t tu?n tr??c khi b?t ??u  sinh ??.  C? t? cung c?a qu v? tr? nn m?ng v m?m (b? m?ng c? t? cung) g?n ngy d? sinh c?a qu v?. TRNG ??I ?I?U G TRONG NH?NG L?N KHM TR??C KHI SINH Qu v? s? ???c khm tr??c khi sinh 2 tu?n m?t l?n cho ??n tu?n 36. Sau ?, qu v? s? ???c khm hng tu?n tr??c khi sinh. Trong m?t l?n khm tr??c khi sinh:  Qu v? s? ???c cn n?ng ?? ??m b?o qu v? v thai nhi ?ang pht tri?n bnh th??ng.  Qu v? ???c ?o huy?t p.  Qu v? s? ???c ?o b?ng ?? theo di s? pht tri?n c?a b.  S? nghe th?y nh?p tim thai.  B?t k? k?t qu? ki?m tra no trong l?n khm tr??c ? s? ???c th?o lu?n.  Qu v? c   th? ???c ki?m tra c? t? cung g?n ngy d? sinh ?? xem c? t? cung ? m?ng ch?a. Lc ???c kho?ng 36 tu?n, chuyn gia ch?m sc y t? c?a qu v? s? ki?m tra c? t? cung c?a qu v?. Cng lc ?, chuyn gia ch?m sc y t? c?ng s? th?c hi?n ki?m tra ch?t ti?t ra c?a m m ??o. Ki?m tra ny l ?? xc ??nh xem c m?t lo?i vi khu?n, lin c?u nhm B, hay khng. Chuyn gia ch?m sc y t? c?a qu v? s? gi?i thch thm. Chuyn gia ch?m sc y t? c th? h?i qu v?:  K? ho?ch sinh c?a qu v? l g.  Qu v? c?m th?y th? no.  Qu v? c c?m th?y em b c? ??ng.  Li?u qu v? c b?t k? tri?u ch?ng b?t th??ng no, ch?ng h?n nh? r r? ch?t l?ng, ch?y mu, ?au ??u nghim tr?ng ho?c co th?t ? b?ng.  Li?u qu v? c ?ang s? d?ng b?t k? s?n ph?m thu?c l no, bao g?m thu?c l d?ng ht, thu?c l d?ng nhai v thu?c l ?i?n t?.  Li?u qu v? c b?t k? cu h?i no khng. Nh?ng ki?m tra ho?c sng l?c khc c th? ???c th?c hi?n trong k? ba thng th? hai c?a qu v? bao g?m:  Cc xt nghi?m mu ki?m tra n?ng ?? s?t th?p (thi?u mu).  Th? thai ?? ki?m tra s?c kh?e, m?c ?? ho?t ??ng v s? pht tri?n c?a thai nhi. Th? nghi?m ???c th?c hi?n n?u qu v? c m?t s? tnh tr?ng b?nh l, ho?c n?u c nh?ng v?n ?? trong qu trnh mang thai.  Xt nghi?m HIV (Vi rt suy gi?m mi?n d?ch ? ng??i). N?u qu v? c nguy c? cao, qu v? c th? ???c ki?m tra HIV  trong ba thng th? ba c?a k? mang thai. CHUY?N D? GI? Qu v? c th? c?m th?y co th?t nh?, khng ??u m d?n d?n s? h?t. Chng ???c g?i l c?n co th?t Braxton Hicks, hay chuy?n d? gi?. Co th?t c th? ko di trong nhi?u gi?, nhi?u ngy, ho?c th?m ch nhi?u tu?n tr??c khi chuy?n d? th?t. N?u c?n co th?t ??u ??n, t?ng c??ng, ho?c tr? nn ?au ??n, t?t nh?t l ph?i ??n g?p chuyn gia ch?m sc y t? ?? khm. CC D?U HI?U TR? D?  Co th?t gi?ng nh? khi c kinh nguy?t.  Cc c?n co th?t cch nhau 5 pht ho?c nhanh h?n.  Co th?t b?t ??u t? ??nh t? cung v lan xu?ng b?ng d??i v l?ng.  C?m gic p l?c ln khung ch?u t?ng ln ho?c ?au l?ng.  Ti?t d?ch nh?y c n??c ho?c mu t? m ??o. N?u qu v? c b?t k? nh?ng d?u hi?u ny tr??c tu?n 37 c?a thai k?, g?i cho chuyn gia ch?m sc y t? ngay l?p t?c. Qu v? s? c?n ??n b?nh vi?n ?? ???c ki?m tra ngay l?p t?c. H??NG D?N CH?M SC T?I NH  Trnh t?t c? thu?c l, th?o d??c, r??u v ma ty khng ???c k ??n. Cc ha ch?t ny ?nh h??ng ??n s? hnh thnh v pht tri?n c?a em b.  Khng s? d?ng b?t c? cc s?n ph?m thu?c l no, bao g?m thu?c l d?ng ht, thu?c l d?ng nhai v thu?c l ?i?n t?. N?u qu v? c?n gip ?? ?? cai thu?c, hy h?i chuyn gia ch?m sc s?c kh?e. Qu v? c th? nh?n ???c h? tr? t?   v?n v cc ngu?n h? tr? khc ?? gip qu v? b? thu?c l.  Tun th? cc ch? d?n c?a chuyn gia ch?m sc y t? v? vi?c s? d?ng thu?c. C nh?ng lo?i thu?c l an ton ho?c khng an ton trong qu trnh mang thai.  T?p th? d?c th??ng xuyn theo ch? d?n c?a chuyn gia ch?m sc y t?. B? co th?t t? cung l m?t d?u hi?u t?t ?? ng?ng t?p th? d?c.  Ti?p t?c ?n cc b?a ?n th??ng xuyn, lnh m?nh.  M?c m?t chi?c o ng?c nng ?? t?t cho v b? nh?y c?m ?au.  Khng s? d?ng b?n t?m n??c nng, phng xng h?i, ho?c phng t?m h?i.  ?eo dy an ton c?a qu v? m?i lc khi li xe.  Trnh th?t s?ng, ph mai ch?a n?u chn, h?p v? sinh c?a mo, v ??t v? sinh dnh cho mo. Nh?ng th? ny mang  m?m b?nh c th? gy d? t?t b?m sinh cho em b.  U?ng vitamin tr??c khi sinh c?a qu v?.  U?ng 1500-2000 mg canxi m?i ngy b?t ??u t? tu?n th? 20 c?a thai k? cho ??n khi sinh con.  Hy th? dng thu?c lm m?m phn (n?u chuyn gia ch?m sc y t? c?a qu v? ch?p thu?n) n?u qu v? b? to bn. ?n thm th?c ?n giu ch?t x?, nh? rau t??i ho?c tri cy v ng? c?c nguyn h?t. U?ng nhi?u n??c ?? gi? cho n??c ti?u trong ho?c vng nh?t.  T?m b?n ng?i ?? lm d?u c?n ?au ho?c s? kh ch?u do b?nh tr? gy ra. S? d?ng kem tr? n?u chuyn gia ch?m sc y t? ch?p thu?n.  N?u qu v? b? gin t?nh m?ch, hy ?eo t?t h? tr?. Nng cao chn c?a qu v? trong 15 pht, 3-4 l?n m?t ngy. H?n ch? mu?i trong ch? ?? ?n c?a qu v?.  Trnh nng v?t n?ng, hay ?i giy th?p gt, v luy?n t?p t? th? ph h?p.  Ngh? ng?i nhi?u v?i ?i chn c?a qu v? nng cao n?u qu v? b? chu?t rt chn ho?c ?au vng th?t l?ng.  ??n khm nha s? n?u qu v? ch?a ??n trong th?i gian qu v? mang thai. S? d?ng m?t bn ch?i m?m ?? ch?i r?ng c?a qu v? v hy nh? nhng khi dng ch? nha khoa.  C th? ???c ti?p t?c c quan h? tnh d?c tr? khi chuyn gia ch?m sc y t? yu c?u khc.  Khng ?i du l?ch xa tr? khi n l hon ton c?n thi?t v ch? ?i v?i s? ch?p thu?n c?a chuyn gia ch?m sc y t?.  Tham gia cc l?p h?c tr??c khi sinh ?? hi?u, th?c hnh, v ??t cu h?i v? tr? d? v sinh con.  Th?c hi?n m?t l?n th? t?i b?nh vi?n.  Chu?n b? ti ?? ?? ?i vi?n.  Chu?n b? phng cho em b.  Ti?p t?c ??n m?i l?n h?n khm tr??c khi sinh theo ch? d?n c?a chuyn gia ch?m sc y t? c?a qu v?.  ?I KHM N?U:  Qu v? khng ch?c ch?n li?u qu v? c tr? d? ho?c li?u qu v? ? v? ?i hay ch?a.  Qu v? b? hoa m?t.  Qu v? b? co th?t vng ch?u, p l?c ln vng ch?u, ?au m ? ? vng b?ng c?a qu v?.  Qu v? b? bu?n nn, nn m?a ho?c tiu ch?y lin t?c.  Qu v? c d?ch m ??o mi kh ch?u.    Qu v? b? ?au khi ?i ti?u.  NGAY L?P T?C ?I KHM N?U:  Qu v? b? s?t.  Qu  v? b? r r? d?ch t? m ??o.  Qu v? c ra ??m mu ho?c ch?y mu t? m ??o.  Qu v? b? co th?t nghim tr?ng ho?c ?au ? b?ng.  Qu v? gi?m cn ho?c t?ng cn nhanh.  Qu v? b? kh th? cng v?i ?au ng?c.  Qu v? th?y s?ng b?t ng? ho?c r?t to ? m?t, tay, m?t c chn, bn chn ho?c c?ng chn.  Qu v? khng th?y em b c? ??ng trong h?n m?t gi?.  Qu v? b? ?au ??u nghim tr?ng m khng h?t sau khi dng thu?c.  Qu v? b? thay ??i th? l?c.  Thng tin ny khng nh?m m?c ?ch thay th? cho l?i khuyn m chuyn gia ch?m sc s?c kh?e ni v?i qu v?. Hy b?o ??m qu v? ph?i th?o lu?n b?t k? v?n ?? g m qu v? c v?i chuyn gia ch?m sc s?c kh?e c?a qu v?. Document Released: 07/12/2015 Document Revised: 07/12/2015 Document Reviewed: 08/16/2012 Elsevier Interactive Patient Education  2017 Elsevier Inc.  

## 2016-05-13 NOTE — Progress Notes (Signed)
   PRENATAL VISIT NOTE  Subjective:  Erika Alvarez is a 20 y.o. G2P0 at 5131w3d being seen today for ongoing prenatal care.  She is currently monitored for the following issues for this high-risk pregnancy and has Supervision of low-risk pregnancy; Teen pregnancy; and Echogenic intracardiac focus of fetus on prenatal ultrasound on her problem list.  Patient reports occasional contractions.  Contractions: Irregular. Vag. Bleeding: None.  Movement: Present. Denies leaking of fluid.   The following portions of the patient's history were reviewed and updated as appropriate: allergies, current medications, past family history, past medical history, past social history, past surgical history and problem list. Problem list updated.  Objective:   Vitals:   05/13/16 1338  BP: 122/69  Pulse: 91  Weight: 135 lb 8 oz (61.5 kg)    Fetal Status: Fetal Heart Rate (bpm): 147 Fundal Height: 32 cm Movement: Present     General:  Alert, oriented and cooperative. Patient is in no acute distress.  Skin: Skin is warm and dry. No rash noted.   Cardiovascular: Normal heart rate noted  Respiratory: Normal respiratory effort, no problems with respiration noted  Abdomen: Soft, gravid, appropriate for gestational age. Pain/Pressure: Present     Pelvic:  Cervical exam deferred        Extremities: Normal range of motion.  Edema: None  Mental Status: Normal mood and affect. Normal behavior. Normal judgment and thought content.   Assessment and Plan:  Pregnancy: G2P0 at 1231w3d  1. Encounter for supervision of low-risk pregnancy in third trimester Doing well  Preterm labor symptoms and general obstetric precautions including but not limited to vaginal bleeding, contractions, leaking of fluid and fetal movement were reviewed in detail with the patient. Please refer to After Visit Summary for other counseling recommendations.  Return in about 2 weeks (around 05/27/2016).   Adam PhenixJames G Arnold, MD

## 2016-05-26 LAB — OB RESULTS CONSOLE GBS: GBS: NEGATIVE

## 2016-05-27 ENCOUNTER — Other Ambulatory Visit (HOSPITAL_COMMUNITY)
Admission: RE | Admit: 2016-05-27 | Discharge: 2016-05-27 | Disposition: A | Discharge status: Home / Self Care | Payer: Medicaid Other | Source: Ambulatory Visit | Attending: Obstetrics and Gynecology | Admitting: Obstetrics and Gynecology

## 2016-05-27 ENCOUNTER — Ambulatory Visit (INDEPENDENT_AMBULATORY_CARE_PROVIDER_SITE_OTHER): Payer: Medicaid Other | Admitting: Obstetrics and Gynecology

## 2016-05-27 VITALS — BP 116/74 | HR 91 | Wt 136.8 lb

## 2016-05-27 DIAGNOSIS — Z3493 Encounter for supervision of normal pregnancy, unspecified, third trimester: Secondary | ICD-10-CM

## 2016-05-27 DIAGNOSIS — Z113 Encounter for screening for infections with a predominantly sexual mode of transmission: Secondary | ICD-10-CM | POA: Insufficient documentation

## 2016-05-27 DIAGNOSIS — O283 Abnormal ultrasonic finding on antenatal screening of mother: Secondary | ICD-10-CM

## 2016-05-27 DIAGNOSIS — Z789 Other specified health status: Secondary | ICD-10-CM | POA: Insufficient documentation

## 2016-05-27 DIAGNOSIS — Z758 Other problems related to medical facilities and other health care: Secondary | ICD-10-CM | POA: Insufficient documentation

## 2016-05-27 DIAGNOSIS — Z3403 Encounter for supervision of normal first pregnancy, third trimester: Secondary | ICD-10-CM

## 2016-05-27 NOTE — Progress Notes (Signed)
[employer]preter # [phone] 36 wk cultures today

## 2016-05-27 NOTE — Progress Notes (Signed)
Prenatal Visit Note Date: 05/27/2016 Clinic: [facility]  Subjective:  Erika Alvarez is a 20 y.o. G2P0 at 7284w3d being seen today for ongoing prenatal care.  She is currently monitored for the following issues for this low-risk pregnancy and has Supervision of low-risk pregnancy; EIF and CP cysts; and Language barrier on her problem list.  Patient reports no complaints.   Contractions: Irregular. Vag. Bleeding: None.  Movement: Present. Denies leaking of fluid.   The following portions of the patient's history were reviewed and updated as appropriate: allergies, current medications, past family history, past medical history, past social history, past surgical history and problem list. Problem list updated.  Objective:   Vitals:   05/27/16 1319  BP: 116/74  Pulse: 91  Weight: 136 lb 12.8 oz (62.1 kg)    Fetal Status: Fetal Heart Rate (bpm): 152 Fundal Height: 35 cm Movement: Present  Presentation: Vertex  General:  Alert, oriented and cooperative. Patient is in no acute distress.  Skin: Skin is warm and dry. No rash noted.   Cardiovascular: Normal heart rate noted  Respiratory: Normal respiratory effort, no problems with respiration noted  Abdomen: Soft, gravid, appropriate for gestational age. Pain/Pressure: Present     Pelvic:  Cervical exam deferred        Extremities: Normal range of motion.  Edema: None  Mental Status: Normal mood and affect. Normal behavior. Normal judgment and thought content.   Urinalysis:      Assessment and Plan:  Pregnancy: G2P0 at 3684w3d  1. Supervision of low-risk first pregnancy, third trimester Routine care. D/w pt re: BC nv - Culture, beta strep (group b only) - [facility]Chlamydia [facility]  2. Language barrier Interpreter used.   Preterm labor symptoms and general obstetric precautions including but not limited to vaginal bleeding, contractions, leaking of fluid and fetal movement were reviewed in  detail with the patient. Please refer to After Visit Summary for other counseling recommendations.  Return in about 1 week (around 06/03/2016) for 7-10d rob.   [place] Bingharlie Marsean Elkhatib, MD

## 2016-05-28 LAB — GC/CHLAMYDIA PROBE AMP (~~LOC~~) NOT AT ARMC
CHLAMYDIA, DNA PROBE: NEGATIVE
Neisseria Gonorrhea: NEGATIVE

## 2016-05-29 LAB — CULTURE, BETA STREP (GROUP B ONLY)

## 2016-06-08 ENCOUNTER — Ambulatory Visit (INDEPENDENT_AMBULATORY_CARE_PROVIDER_SITE_OTHER): Payer: Medicaid Other | Admitting: Obstetrics & Gynecology

## 2016-06-08 DIAGNOSIS — Z3493 Encounter for supervision of normal pregnancy, unspecified, third trimester: Secondary | ICD-10-CM

## 2016-06-08 NOTE — Progress Notes (Signed)
   PRENATAL VISIT NOTE  Subjective:  Erika Alvarez is a 20 y.o. G2P0 at 7863w1d being seen today for ongoing prenatal care.  She is currently monitored for the following issues for this low-risk pregnancy and has Supervision of low-risk pregnancy and Language barrier on her problem list. Patient is Vietnamese-speaking only, interpreter present for this encounter.   Patient reports no complaints.  Contractions: Not present. Vag. Bleeding: None.  Movement: Present. Denies leaking of fluid.   The following portions of the patient's history were reviewed and updated as appropriate: allergies, current medications, past family history, past medical history, past social history, past surgical history and problem list. Problem list updated.  Objective:   Vitals:   06/08/16 1337  BP: 110/72  Pulse: 89  Weight: 135 lb (61.2 kg)    Fetal Status: Fetal Heart Rate (bpm): 146 Fundal Height: 36 cm Movement: Present     General:  Alert, oriented and cooperative. Patient is in no acute distress.  Skin: Skin is warm and dry. No rash noted.   Cardiovascular: Normal heart rate noted  Respiratory: Normal respiratory effort, no problems with respiration noted  Abdomen: Soft, gravid, appropriate for gestational age. Pain/Pressure: Absent     Pelvic:  Cervical exam deferred        Extremities: Normal range of motion.  Edema: None  Mental Status: Normal mood and affect. Normal behavior. Normal judgment and thought content.   Assessment and Plan:  Pregnancy: G2P0 at 7763w1d  1. Encounter for supervision of low-risk pregnancy in third trimester Negative pelvic cultures, reviewed with patient. Term labor symptoms and general obstetric precautions including but not limited to vaginal bleeding, contractions, leaking of fluid and fetal movement were reviewed in detail with the patient. Please refer to After Visit Summary for other counseling recommendations.  Return in about 1 week (around 06/15/2016) for OB  Visit.   Tereso NewcomerUgonna A Jaidynn Balster, MD

## 2016-06-08 NOTE — Patient Instructions (Signed)
Return to clinic for any scheduled appointments or obstetric concerns, or go to [facility] for evaluation  

## 2016-06-15 ENCOUNTER — Ambulatory Visit (INDEPENDENT_AMBULATORY_CARE_PROVIDER_SITE_OTHER): Payer: Medicaid Other | Admitting: Advanced Practice Midwife

## 2016-06-15 ENCOUNTER — Encounter: Payer: Self-pay | Admitting: Advanced Practice Midwife

## 2016-06-15 DIAGNOSIS — Z789 Other specified health status: Secondary | ICD-10-CM

## 2016-06-15 DIAGNOSIS — Z3483 Encounter for supervision of other normal pregnancy, third trimester: Secondary | ICD-10-CM

## 2016-06-15 NOTE — Progress Notes (Signed)
   PRENATAL VISIT NOTE  Subjective:  Erika Alvarez is a 20 y.o. G2P0 at 1490w1d being seen today for ongoing prenatal care.  She is currently monitored for the following issues for this low-risk pregnancy and has Supervision of low-risk pregnancy and Language barrier on her problem list.  Patient reports no complaints.  Contractions: Not present.  .  Movement: Present. Denies leaking of fluid.   The following portions of the patient's history were reviewed and updated as appropriate: allergies, current medications, past family history, past medical history, past social history, past surgical history and problem list. Problem list updated.  Objective:   Vitals:   06/15/16 1407  BP: 129/72  Pulse: 72  Weight: 134 lb (60.8 kg)    Fetal Status: Fetal Heart Rate (bpm): 154   Movement: Present     General:  Alert, oriented and cooperative. Patient is in no acute distress.  Skin: Skin is warm and dry. No rash noted.   Cardiovascular: Normal heart rate noted  Respiratory: Normal respiratory effort, no problems with respiration noted  Abdomen: Soft, gravid, appropriate for gestational age. Pain/Pressure: Absent     Pelvic:  Cervical exam deferred        Extremities: Normal range of motion.     Mental Status: Normal mood and affect. Normal behavior. Normal judgment and thought content.   Assessment and Plan:  Pregnancy: G2P0 at 9390w1d  There are no diagnoses linked to this encounter. Term labor symptoms and general obstetric precautions including but not limited to vaginal bleeding, contractions, leaking of fluid and fetal movement were reviewed in detail with the patient. Please refer to After Visit Summary for other counseling recommendations.  RTC 1 week Interpretor used (phone)  Aviva SignsMarie L Antonia Culbertson, CNM

## 2016-06-15 NOTE — Patient Instructions (Signed)

## 2016-06-23 ENCOUNTER — Ambulatory Visit (INDEPENDENT_AMBULATORY_CARE_PROVIDER_SITE_OTHER): Payer: Medicaid Other | Admitting: Student

## 2016-06-23 VITALS — BP 114/71 | HR 84 | Temp 98.4°F | Wt 136.0 lb

## 2016-06-23 DIAGNOSIS — Z3493 Encounter for supervision of normal pregnancy, unspecified, third trimester: Secondary | ICD-10-CM

## 2016-06-23 NOTE — Progress Notes (Signed)
   PRENATAL VISIT NOTE  Subjective:  Erika Alvarez is a 20 y.o. G2P0 at 22w2dbeing seen today for ongoing prenatal care.  She is currently monitored for the following issues for this low-risk pregnancy and has Supervision of low-risk pregnancy and Language barrier on her problem list.  Patient reports no complaints.  Contractions: Irritability. Vag. Bleeding: None.  Movement: Present. Denies leaking of fluid.   The following portions of the patient's history were reviewed and updated as appropriate: allergies, current medications, past family history, past medical history, past social history, past surgical history and problem list. Problem list updated.  Objective:   Vitals:   06/23/16 1342  BP: 114/71  Pulse: 84  Temp: 98.4 F (36.9 C)  Weight: 136 lb (61.7 kg)    Fetal Status: Fetal Heart Rate (bpm): 138 Fundal Height: 37 cm Movement: Present  Presentation: Vertex  General:  Alert, oriented and cooperative. Patient is in no acute distress.  Skin: Skin is warm and dry. No rash noted.   Cardiovascular: Normal heart rate noted  Respiratory: Normal respiratory effort, no problems with respiration noted  Abdomen: Soft, gravid, appropriate for gestational age. Pain/Pressure: Present     Pelvic:  Cervical exam performed Dilation: Closed Effacement (%): 50    Extremities: Normal range of motion.  Edema: None  Mental Status: Normal mood and affect. Normal behavior. Normal judgment and thought content.   Assessment and Plan:  Pregnancy: G2P0 at 312w2dEncounter for supervision of low-risk pregnancy in third trimester Pregnancy: G2P0 at 3987w2datient doing well; has no complaints or questions. Video interpreter Erika [phone] for visit.   Term labor symptoms and general obstetric precautions including but not limited to vaginal bleeding, contractions, leaking of fluid and fetal movement were reviewed in detail with the patient. Please refer to After Visit Summary for other  counseling recommendations.  Return in about 1 week (around 06/30/2016) for ob fup.   KatStarr LakeNM

## 2016-06-25 ENCOUNTER — Inpatient Hospital Stay (HOSPITAL_COMMUNITY): Payer: Medicaid Other

## 2016-06-25 ENCOUNTER — Encounter (HOSPITAL_COMMUNITY): Payer: Self-pay | Admitting: *Deleted

## 2016-06-25 ENCOUNTER — Inpatient Hospital Stay (HOSPITAL_COMMUNITY)
Admission: AD | Admit: 2016-06-25 | Discharge: 2016-06-27 | DRG: 775 | Disposition: A | Discharge status: Home / Self Care | Payer: Medicaid Other | Source: Ambulatory Visit | Attending: Obstetrics & Gynecology | Admitting: Obstetrics & Gynecology

## 2016-06-25 DIAGNOSIS — Z3A39 39 weeks gestation of pregnancy: Secondary | ICD-10-CM

## 2016-06-25 DIAGNOSIS — O368390 Maternal care for abnormalities of the fetal heart rate or rhythm, unspecified trimester, not applicable or unspecified: Secondary | ICD-10-CM

## 2016-06-25 LAB — CBC
HCT: 37.9 % (ref 36.0–46.0)
HEMOGLOBIN: 13.1 g/dL (ref 12.0–15.0)
MCH: 30.4 pg (ref 26.0–34.0)
MCHC: 34.6 g/dL (ref 30.0–36.0)
MCV: 87.9 fL (ref 78.0–100.0)
PLATELETS: 203 10*3/uL (ref 150–400)
RBC: 4.31 MIL/uL (ref 3.87–5.11)
RDW: 14 % (ref 11.5–15.5)
WBC: 9.3 10*3/uL (ref 4.0–10.5)

## 2016-06-25 LAB — TYPE AND SCREEN
ABO/RH(D): A POS
ANTIBODY SCREEN: NEGATIVE

## 2016-06-25 LAB — ABO/RH: ABO/RH(D): A POS

## 2016-06-25 MED ORDER — LACTATED RINGERS IV SOLN: 500.0000 mL | INTRAVENOUS | Status: DC | PRN

## 2016-06-25 MED ORDER — ONDANSETRON HCL 4 MG PO TABS: 4.0000 mg | ORAL_TABLET | ORAL | Status: DC | PRN

## 2016-06-25 MED ORDER — FLEET ENEMA 7-19 GM/118ML RE ENEM: 1.0000 | ENEMA | RECTAL | Status: DC | PRN

## 2016-06-25 MED ORDER — OXYCODONE-ACETAMINOPHEN 5-325 MG PO TABS: 1.0000 | ORAL_TABLET | ORAL | Status: DC | PRN

## 2016-06-25 MED ORDER — SENNOSIDES-DOCUSATE SODIUM 8.6-50 MG PO TABS
2.0000 | ORAL_TABLET | ORAL | Status: DC
  Administered 2016-06-26 – 2016-06-27 (×2): 2 via ORAL
  Filled 2016-06-25 (×2): qty 2

## 2016-06-25 MED ORDER — SODIUM CHLORIDE 0.9% FLUSH: 3.0000 mL | INTRAVENOUS | Status: DC | PRN

## 2016-06-25 MED ORDER — FENTANYL 2.5 MCG/ML BUPIVACAINE 1/10 % EPIDURAL INFUSION (WH - ANES): 14.0000 mL/h | INTRAMUSCULAR | Status: DC | PRN

## 2016-06-25 MED ORDER — EPHEDRINE 5 MG/ML INJ
10.0000 mg | INTRAVENOUS | Status: DC | PRN
Start: 2016-06-25 — End: 2016-06-25
  Filled 2016-06-25: qty 4

## 2016-06-25 MED ORDER — SODIUM CHLORIDE 0.9% FLUSH: 3.0000 mL | Freq: Two times a day (BID) | INTRAVENOUS | Status: DC

## 2016-06-25 MED ORDER — ACETAMINOPHEN 325 MG PO TABS: 650.0000 mg | ORAL_TABLET | ORAL | Status: DC | PRN

## 2016-06-25 MED ORDER — BENZOCAINE-MENTHOL 20-0.5 % EX AERO
1.0000 "application " | INHALATION_SPRAY | CUTANEOUS | Status: DC | PRN
  Administered 2016-06-26: 1 via TOPICAL
  Filled 2016-06-25: qty 56

## 2016-06-25 MED ORDER — LACTATED RINGERS IV SOLN
500.0000 mL | Freq: Once | INTRAVENOUS | Status: AC
  Administered 2016-06-25: 500 mL via INTRAVENOUS

## 2016-06-25 MED ORDER — SIMETHICONE 80 MG PO CHEW: 80.0000 mg | CHEWABLE_TABLET | ORAL | Status: DC | PRN

## 2016-06-25 MED ORDER — PHENYLEPHRINE 40 MCG/ML (10ML) SYRINGE FOR IV PUSH (FOR BLOOD PRESSURE SUPPORT)
80.0000 ug | PREFILLED_SYRINGE | INTRAVENOUS | Status: DC | PRN
  Filled 2016-06-25: qty 5

## 2016-06-25 MED ORDER — FENTANYL CITRATE (PF) 100 MCG/2ML IJ SOLN
50.0000 ug | INTRAMUSCULAR | Status: DC | PRN
  Administered 2016-06-25: 50 ug via INTRAVENOUS
  Filled 2016-06-25: qty 2

## 2016-06-25 MED ORDER — COCONUT OIL OIL: 1.0000 "application " | TOPICAL_OIL | Status: DC | PRN

## 2016-06-25 MED ORDER — TERBUTALINE SULFATE 1 MG/ML IJ SOLN
0.2500 mg | Freq: Once | INTRAMUSCULAR | Status: DC | PRN
  Filled 2016-06-25: qty 1

## 2016-06-25 MED ORDER — ONDANSETRON HCL 4 MG/2ML IJ SOLN: 4.0000 mg | INTRAMUSCULAR | Status: DC | PRN

## 2016-06-25 MED ORDER — OXYTOCIN 40 UNITS IN LACTATED RINGERS INFUSION - SIMPLE MED: 2.5000 [IU]/h | INTRAVENOUS | Status: DC | PRN

## 2016-06-25 MED ORDER — ONDANSETRON HCL 4 MG/2ML IJ SOLN: 4.0000 mg | Freq: Four times a day (QID) | INTRAMUSCULAR | Status: DC | PRN

## 2016-06-25 MED ORDER — SODIUM CHLORIDE 0.9 % IV SOLN: 250.0000 mL | INTRAVENOUS | Status: DC | PRN

## 2016-06-25 MED ORDER — OXYCODONE-ACETAMINOPHEN 5-325 MG PO TABS: 2.0000 | ORAL_TABLET | ORAL | Status: DC | PRN

## 2016-06-25 MED ORDER — LACTATED RINGERS IV SOLN
INTRAVENOUS | Status: DC
  Administered 2016-06-25: 15:00:00 via INTRAVENOUS

## 2016-06-25 MED ORDER — LIDOCAINE HCL (PF) 1 % IJ SOLN
30.0000 mL | INTRAMUSCULAR | Status: AC | PRN
  Administered 2016-06-25 (×2): 30 mL via SUBCUTANEOUS
  Filled 2016-06-25 (×2): qty 30

## 2016-06-25 MED ORDER — WITCH HAZEL-GLYCERIN EX PADS: 1.0000 "application " | MEDICATED_PAD | CUTANEOUS | Status: DC | PRN

## 2016-06-25 MED ORDER — OXYTOCIN BOLUS FROM INFUSION
500.0000 mL | Freq: Once | INTRAVENOUS | Status: AC
  Administered 2016-06-25: 500 mL via INTRAVENOUS

## 2016-06-25 MED ORDER — IBUPROFEN 600 MG PO TABS
600.0000 mg | ORAL_TABLET | Freq: Four times a day (QID) | ORAL | Status: DC
  Administered 2016-06-26 – 2016-06-27 (×6): 600 mg via ORAL
  Filled 2016-06-25 (×7): qty 1

## 2016-06-25 MED ORDER — DIPHENHYDRAMINE HCL 50 MG/ML IJ SOLN: 12.5000 mg | INTRAMUSCULAR | Status: DC | PRN

## 2016-06-25 MED ORDER — EPHEDRINE 5 MG/ML INJ
10.0000 mg | INTRAVENOUS | Status: DC | PRN
  Filled 2016-06-25: qty 4

## 2016-06-25 MED ORDER — DIBUCAINE 1 % RE OINT: 1.0000 "application " | TOPICAL_OINTMENT | RECTAL | Status: DC | PRN

## 2016-06-25 MED ORDER — SOD CITRATE-CITRIC ACID 500-334 MG/5ML PO SOLN: 30.0000 mL | ORAL | Status: DC | PRN

## 2016-06-25 MED ORDER — OXYTOCIN 40 UNITS IN LACTATED RINGERS INFUSION - SIMPLE MED
1.0000 m[IU]/min | INTRAVENOUS | Status: DC
  Administered 2016-06-25: 2 m[IU]/min via INTRAVENOUS
  Filled 2016-06-25: qty 1000

## 2016-06-25 MED ORDER — ZOLPIDEM TARTRATE 5 MG PO TABS
5.0000 mg | ORAL_TABLET | Freq: Every evening | ORAL | Status: DC | PRN
Start: 2016-06-25 — End: 2016-06-27

## 2016-06-25 MED ORDER — PRENATAL MULTIVITAMIN CH
1.0000 | ORAL_TABLET | Freq: Every day | ORAL | Status: DC
  Administered 2016-06-27: 1 via ORAL
  Filled 2016-06-25: qty 1

## 2016-06-25 MED ORDER — OXYTOCIN 40 UNITS IN LACTATED RINGERS INFUSION - SIMPLE MED: 2.5000 [IU]/h | INTRAVENOUS | Status: DC

## 2016-06-25 MED ORDER — TETANUS-DIPHTH-ACELL PERTUSSIS 5-2.5-18.5 LF-MCG/0.5 IM SUSP: 0.5000 mL | Freq: Once | INTRAMUSCULAR | Status: DC

## 2016-06-25 MED ORDER — DIPHENHYDRAMINE HCL 25 MG PO CAPS: 25.0000 mg | ORAL_CAPSULE | Freq: Four times a day (QID) | ORAL | Status: DC | PRN

## 2016-06-25 NOTE — H&P (Signed)
LABOR AND DELIVERY ADMISSION HISTORY AND PHYSICAL NOTE  Erika Alvarez is a 20 y.o. female G2P0000 with IUP at 2834w4d by LMP c/w 17 wk US presenting for SOL (latent) and NRFHT.   She reports positive fetal movement. She denies leakage of fluid or vaginal bleeding.  In MAU, patient did not initially make cervical change. She did not have a reactive FHT, so was sent for BPP. BPP was 8/8, but after return from US patient had a few recurrent variable decelerations, and still was not found to have a reactive FHT (minimal to moderate variability without accels). When her cervix was rechecked she made change from 2cm to 4cm. It was determined to admit patient and help augment her labor.    Past Medical History: Past Medical History:  Diagnosis Date  . Constipation   . Tendonitis of left hip     Past Surgical History: No past surgical history on file.  Obstetrical History: OB History    Gravida Para Term Preterm AB Living   2 0 0 0 0 0   SAB TAB Ectopic Multiple Live Births   0 0 0 0 0      Social History: Social History   Social History  . Marital status: Married    Spouse name: N/A  . Number of children: N/A  . Years of education: N/A   Social History Main Topics  . Smoking status: Never Smoker  . Smokeless tobacco: Never Used  . Alcohol use No  . Drug use: No  . Sexual activity: Yes   Other Topics Concern  . Not on file   Social History Narrative  . No narrative on file    Family History: Family History  Problem Relation Age of Onset  . Cirrhosis Mother   . Colitis Father     Allergies: Allergies  Allergen Reactions  . Acetaminophen Itching and Rash    Prescriptions Prior to Admission  Medication Sig Dispense Refill Last Dose  . Prenatal Vit w/Fe-Methylfol-FA (PNV PO) Take 1 tablet by mouth daily.    06/24/2016 at Unknown time     Review of Systems   All systems reviewed and negative except as stated in HPI  Blood pressure 121/73, pulse 97,  temperature 98.4 F (36.9 C), temperature source Oral, resp. rate 20, height 5\' 5"  (1.651 m), weight 136 lb (61.7 kg), last menstrual period 09/22/2015, unknown if currently breastfeeding. General appearance: alert, cooperative, appears stated age and no distress Lungs: clear to auscultation bilaterally Heart: regular rate and rhythm Abdomen: soft, non-tender; bowel sounds normal Extremities: No calf swelling or tenderness Presentation: cephalic Fetal monitoring: 135, minimal to moderate variability, good scalp stimulation, no accels, a couple variable decels to 100 bpm for <281min with quick return to baseline Uterine activity: q2-326min  Dilation: 4 Effacement (%): 100 Station: -1 Exam by:: Kynleigh Artz, MD  AROM performed, clear fluid returned, and IUPC placed.    Prenatal labs: ABO, Rh: A/POS/-- (06/14 1121) Antibody: NEG (06/14 1121) Rubella: !Error! IMMUNE RPR: NON REAC (10/10 1339)  HBsAg: NEGATIVE (06/14 1121)  HIV: NONREACTIVE (10/10 1339)  GBS: Negative (11/28 0000)  1 hr Glucola: 72 Genetic screening:  Declined Anatomy US: Normal with isolated LVEIF, boy  Prenatal Transfer Tool  Maternal Diabetes: No Genetic Screening: Declined Maternal Ultrasounds/Referrals: Normal Fetal Ultrasounds or other Referrals:  None Maternal Substance Abuse:  No Significant Maternal Medications:  None Significant Maternal Lab Results: Lab values include: Group B Strep negative  Results for orders placed or performed  during the hospital encounter of 06/25/16 (from the past 24 hour(s))  CBC   Collection Time: 06/25/16  2:20 PM  Result Value Ref Range   WBC 9.3 4.0 - 10.5 K/uL   RBC 4.31 3.87 - 5.11 MIL/uL   Hemoglobin 13.1 12.0 - 15.0 g/dL   HCT 69.637.9 29.536.0 - 28.446.0 %   MCV 87.9 78.0 - 100.0 fL   MCH 30.4 26.0 - 34.0 pg   MCHC 34.6 30.0 - 36.0 g/dL   RDW 13.214.0 44.011.5 - 10.215.5 %   Platelets 203 150 - 400 K/uL    Patient Active Problem List   Diagnosis Date Noted  . Normal labor 06/25/2016  .  Language barrier 05/27/2016  . Supervision of low-risk pregnancy 12/11/2015    Assessment: Erika Alvarez is a 20 y.o. G2P0000 at 5975w4d here for SOL/NRFHT.  #Labor: Augmentation of labor with AROM (clear fluid returned), IUPC placed, and pitocin started #Pain: IV pain meds prn, epidural on request #FWB: Cat II #ID:  GBS Neg #MOF: Breast #MOC:Rhythm method and condoms #Circ:  No  Erika MowElizabeth Milo Solana, DO OB Fellow [facility] 06/25/2016, 3:07 PM

## 2016-06-25 NOTE — Lactation Note (Signed)
This note was copied from a baby's chart. Lactation Consultation Note  Patient Name: Erika Alvarez Today's Date: 06/25/2016 Reason for consult: Initial assessment   Initial [facility] Spoke with mom with assistance of [employer]pre[employer] # [phone].  Infant STS with mom and RN is assisting mom in latching infant to breast. Attempted to latch infant to right breast in the football hold, he did not sustain latch. Mom with small firm breasts with compressible puffy areola and short shaft nipples. Colostrum easily expressible from both breasts. Mom voiced that she does not have any milk, discussed colostrum, infant nutritional needs and stomach size and milk coming to volume. Showed mom that she has colostrum present. Infant then placed to left breast in the laid back cross cradle hold, mom kept moving around and infant had difficulty maintaining latch. We then placed him to the right breast in the laid back cross cradle hold, he was able to sustain latch better in this position.   Enc mom to feed infant STS 8-12 x in 24 hours. After several minutes of feeding, mom and dad asked about giving formula. Mom reports she plans to give formula after BF. Discussed supply and demand and enc mom to BF infant first and then offer the formula if family desires. Feeding log given with instructions for use.   BF Resources Handout and LC Brochure given, mom informed of IP/OP Services, BF Support Groups and LC phone #. Enc mom to call out for feeding assistance as needed.    Maternal Data Formula Feeding for Exclusion: Yes Reason for exclusion: Mother's choice to formula and breast feed on admission Has patient been taught Hand Expression?: Yes Does the patient have breastfeeding experience prior to this delivery?: No  Feeding Feeding Type: Breast Fed Length of feed: 20 min  LATCH Score/Interventions Latch: Repeated attempts needed to  sustain latch, nipple held in mouth throughout feeding, stimulation needed to elicit sucking reflex. Intervention(s): Adjust position;Assist with latch;Breast massage;Breast compression  Audible Swallowing: A few with stimulation Intervention(s): Alternate breast massage;Hand expression;Skin to skin  Type of Nipple: Everted at rest and after stimulation  Comfort (Breast/Nipple): Soft / non-tender     Hold (Positioning): Assistance needed to correctly position infant at breast and maintain latch. Intervention(s): Breastfeeding basics reviewed;Support Pillows;Position options;Skin to skin  LATCH Score: 7  Lactation Tools Discussed/Used WIC Program: Yes   Consult Status Consult Status: Follow-up Date: 06/26/16 Follow-up type: In-patient    Silas FloodSharon S Amee Boothe 06/25/2016, 6:01 PM

## 2016-06-25 NOTE — MAU Note (Signed)
Pt started having uc's around midnight, have become progressively worse.  Denies bleeding or LOF.

## 2016-06-25 NOTE — MAU Note (Signed)
U/S report still not available. Contacted Carrie in U/S dept and states she will scan in prelim, but not sure when report will be read by radiologist.

## 2016-06-26 LAB — CBC
HCT: 32.3 % — ABNORMAL LOW (ref 36.0–46.0)
Hemoglobin: 11.2 g/dL — ABNORMAL LOW (ref 12.0–15.0)
MCH: 30.4 pg (ref 26.0–34.0)
MCHC: 34.7 g/dL (ref 30.0–36.0)
MCV: 87.8 fL (ref 78.0–100.0)
PLATELETS: 194 10*3/uL (ref 150–400)
RBC: 3.68 MIL/uL — AB (ref 3.87–5.11)
RDW: 14.1 % (ref 11.5–15.5)
WBC: 14 10*3/uL — AB (ref 4.0–10.5)

## 2016-06-26 LAB — RPR: RPR: NONREACTIVE

## 2016-06-26 NOTE — Lactation Note (Signed)
This note was copied from a baby's chart. Lactation Consultation Note Follow up visit at 27 hours of age.  LC provided ipad for interpreter services, FOB declines and interprets for mom.  Mom understands some english.   Baby has had 4 feedings >10 minutes and several spoon feedings with 4 voids and 2 stools.   Mom holding baby after feeding of about 40 minutes with sucking on and off.  LC attempted to latch baby to right breast, mom reports more difficult on right breast.  Baby does not maintain feeding well.   Baby noted to have bowl shaped tongue when crying, diaper changed.  LC allowed baby to suck gloved finger and baby bites.  LC easily hand expressed several mls of colostrum to spoon feed baby, Baby does well extending tongue to spoon to "lick." LC discussed suck training to encouraged tongue to extend and spoon feeding to encourage tongue extension with feedings.   Plan mom to attempt feedings with early cues of wake baby as needed.  Mom to attempt latching, suck training and spoon feeding if needed.  IF baby does not tolerate latch with good feeding mom will begin pumping with DEBP to stimulate/ protect milk supply.  LC discussed plan with parents. Mom has medicaid, but does not have WIC, LC questions ability to have DEBP used at home.   LC impression, if baby will extend tongue baby should be able to maintain latch better, with good milk flow baby may transfer well at the breast in next 24 hours if baby is more active for feedings.  [facility], Marcelino DusterMichelle   Patient Name: Erika Alvarez Today's Date: 06/26/2016 Reason for consult: Follow-up assessment;Difficult latch   Maternal Data    Feeding Feeding Type: Breast Fed Length of feed:  (few sucks)  LATCH Score/Interventions Latch: Repeated attempts needed to sustain latch, nipple held in mouth throughout feeding, stimulation needed to elicit sucking reflex. Intervention(s): Skin to skin;Teach feeding cues;Waking  techniques Intervention(s): Breast massage;Breast compression  Audible Swallowing: None Intervention(s): Skin to skin Intervention(s): Skin to skin  Type of Nipple: Everted at rest and after stimulation  Comfort (Breast/Nipple): Soft / non-tender     Hold (Positioning): Assistance needed to correctly position infant at breast and maintain latch. Intervention(s): Breastfeeding basics reviewed;Support Pillows;Position options;Skin to skin  LATCH Score: 6  Lactation Tools Discussed/Used WIC Program: No   Consult Status Consult Status: Follow-up Date: 06/27/16 Follow-up type: In-patient    Jannifer RodneyShoptaw, Erika Alvarez 06/26/2016, 7:08 PM

## 2016-06-26 NOTE — Progress Notes (Signed)
Post Partum Day 1 Subjective: Pt is PPD # 1 from a TSVD. She has no compliants this morning. Tolerating diet. Bottle feeding. Pain controlled. Lochica min.  Objective: Blood pressure (!) 112/57, pulse 91, temperature 99.3 F (37.4 C), temperature source Oral, resp. rate 18, height 5\' 5"  (1.651 m), weight 136 lb (61.7 kg), last menstrual period 09/22/2015, SpO2 100 %, unknown if currently breastfeeding.  Physical Exam:  General: alert Lochia: appropriate Uterine Fundus: firm Incision: healing well DVT Evaluation: No evidence of DVT seen on physical exam.   Recent Labs  06/25/16 1420 06/26/16 0529  HGB 13.1 11.2*  HCT 37.9 32.3*    Assessment/Plan: Plan for discharge tomorrow   LOS: 1 day   Hermina StaggersMichael L Grete Bosko 06/26/2016, 9:10 AM

## 2016-06-27 MED ORDER — DOCUSATE SODIUM 100 MG PO CAPS: 100.0000 mg | ORAL_CAPSULE | Freq: Two times a day (BID) | ORAL | 0 refills | Status: DC

## 2016-06-27 MED ORDER — IBUPROFEN 600 MG PO TABS: 600.0000 mg | ORAL_TABLET | Freq: Four times a day (QID) | ORAL | 0 refills | Status: DC

## 2016-06-27 NOTE — Discharge Summary (Signed)
OB Discharge Summary     Patient Name: Erika Alvarez DOB: May 15, 1996 MRN: [id number]  Date of admission: 06/25/2016 Delivering MD: Erika Alvarez, Erika Alvarez   Date of discharge: 06/27/2016  Admitting diagnosis: 39WKS CTX Intrauterine pregnancy: 3529w4d     Secondary diagnosis:  Active Problems:   Normal labor   Normal vaginal delivery  Additional problems: None     Discharge diagnosis: Term Pregnancy Delivered                                                                                                Post partum procedures:None  Augmentation: AROM and Pitocin  Complications: None  Hospital course:  Onset of Labor With Vaginal Delivery     20 y.o. yo G2P1001 at 5929w4d was admitted in Active Labor on 06/25/2016. Patient had an uncomplicated labor course as follows:  Membrane Rupture Time/Date: 2:54 PM ,06/25/2016   Intrapartum Procedures: Episiotomy: None [1]                                         Lacerations:  2nd degree [3];Perineal [11];Periurethral [8]  Patient had a delivery of a Viable infant. 06/25/2016  Information for the patient's newborn:  Erika Alvarez, Boy Erika Alvarez [[id number]]  Delivery Method: Vaginal, Spontaneous Delivery (Filed from Delivery Summary)    Pateint had an uncomplicated postpartum course.  She is ambulating, tolerating a regular diet, passing flatus, and urinating well. Patient is discharged home in stable condition on 06/27/16.    Physical exam Vitals:   06/26/16 0030 06/26/16 1800 06/26/16 1955 06/27/16 0520  BP: (!) 112/57 107/66  (!) 114/57  Pulse: 91 75  83  Resp: 18 18  18   Temp: 99.3 F (37.4 C) 98.2 F (36.8 C) 98.3 F (36.8 C) 97.7 F (36.5 C)  TempSrc: Oral Oral Oral Oral  SpO2: 100%     Weight:      Height:       General: alert, cooperative and no distress Lochia: appropriate Uterine Fundus: firm Incision: N/A DVT Evaluation: No evidence of DVT seen on physical exam. Negative Homan's sign. No cords or calf tenderness. Labs: Lab  Results  Component Value Date   WBC 14.0 (H) 06/26/2016   HGB 11.2 (L) 06/26/2016   HCT 32.3 (L) 06/26/2016   MCV 87.8 06/26/2016   PLT 194 06/26/2016   No flowsheet data found.  Discharge instruction: per After Visit Summary and "Baby and Me Booklet".  After visit meds:  Allergies as of 06/27/2016      Reactions   Acetaminophen Itching, Rash      Medication List    TAKE these medications   docusate sodium 100 MG capsule Commonly known as:  COLACE Take 1 capsule (100 mg total) by mouth 2 (two) times daily.   ibuprofen 600 MG tablet Commonly known as:  ADVIL,MOTRIN Take 1 tablet (600 mg total) by mouth every 6 (six) hours.   PNV PO Take 1 tablet by mouth daily.  Diet: routine diet  Activity: Advance as tolerated. Pelvic rest for 6 weeks.   Outpatient follow up:6 weeks Follow up Appt:Future Appointments Date Time Provider Department Center  07/02/2016 3:00 PM Judeth HornErin Lawrence, NP [facility]   Follow up Visit:No Follow-up on file.  Postpartum contraception: Natural Family Planning and Condoms  Newborn Data: Live born female  Birth Weight: 6 lb 7.5 oz (2934 g) APGAR: 9, 9  Baby Feeding: Breast Disposition:home with mother   06/27/2016 Jen MowElizabeth Toribio Seiber, DO OB Fellow

## 2016-06-27 NOTE — Lactation Note (Signed)
This note was copied from a baby's chart. Lactation Consultation Note  Patient Name: Erika Alvarez Today's Date: 06/27/2016 Reason for consult: Follow-up assessment  Visited with Mom and FOB on day of discharge, baby 1143 hrs old.  Baby term, 6063w4d, and at 4.7% WL today. 3 voids, and 3 stools last 24 hrs.  Breasts filling, not engorged, and on hand expression, plenty of transitional milk expressed easily.  Baby placed in football hold, reinforced importance of STS, and frequent feedings today (>10 per 24 hrs), and support of breast during feedings.  Baby latches easily, but taught Mom how to wait for a wider mouth before bringing baby onto breast.  Multiple swallowing identified for Mom and FOB.  Nipple noted to be rounded following 10 minute feeding.  Manual pump given with instructions on cleaning and use.  Engorgement prevention and treatment discussed.  Baby started rooting when lying on Mom's chest.  Watched baby latch independently without LC assistance.  Mom corrected first shallow latch, and baby latched more deeply and comfortably second time.  2:1 suck swallows noted.  Demonstrated breast compression during sucking bursts.   Informed Mom and FOB of OP lactation services, and showed FOB phone number on Lactation brochure.  Encouraged to call prn.  Consult Status Consult Status: Complete Date: 06/27/16 Follow-up type: Call as needed    Judee ClaraSmith, Tyrianna Lightle E 06/27/2016, 11:10 AM

## 2016-06-27 NOTE — Progress Notes (Signed)
[employer] # [phone] used for discharge teaching

## 2016-06-27 NOTE — Discharge Instructions (Signed)
H??ng d?n ch?m [place] t?i nh dnh cho b m? (Home Care Instructions for Mom) HO?T ??NG  D?n tr? l?i t?t c? cc ho?t ??ng th??ng xuyn c?a qu v?.  ?? b?n thn qu v? ???c ngh? ng?i. Ch?p m?t khi con qu v? ng?.  Trnh nng b?t k? v?t g n?ng h?n 10 lb (4,5 kg) cho ??n khi chuyn gia ch?m [place] s?c kh?e ni c th? lm v?y.  Hessie Diener cc ho?t ??ng c?n nhi?u n? l?c v n?ng l??ng (?i h?i g?ng s?c) cho ??n khi chuyn gia ch?m [place] s?c kh?e ch?p thu?n. ?i l?i v?i t?c ?? ch?m ??n trung bnh th??ng l an ton.  N?u qu v? ?? m?:  Khng ht b?i, tro c?u thang ho?c li xe trong 4-6 tu?n.  Nh? ai ? gip qu v? ? nh cho ??n khi qu v? c?m th?y c th? t? lm cc vi?c bnh th??ng.  T?p th? d?c theo ch? d?n c?a chuyn gia ch?m [place] s?c kh?e, n?u ?i?u ny ???c p d?ng. CH?Y MU M ??O Qu v? c th? ti?p t?c ch?y mu trong 4-6 tu?n sau khi sinh con. Theo th?i gian, l??ng mu th??ng gi?m v mu mu s? nh?t h?n. Tuy nhin, dng mu mu ?? nh?t c th? t?ng ln n?u qu v? ho?t ??ng qu m?c. N?u qu v? c?n nhi?u h?n m?t b?ng v? sinh trong m?t gi? v b?ng b? th?m ??t, ho?c n?u qu v? ?i ti?u ra m?t c?c mu l?n:  N?m xu?ng.  Nng cao chn.  ??t b?ng p l?nh ln ph?n b?ng d??i c?a qu v?.  Ngh? ng?i.  G?i chuyn gia ch?m [place] s?c kh?e. N?u qu v? nui con b?ng s?a m?, qu v? s? c kinh nguy?t tr? l?i vo b?t k? lc no t? lc 8 tu?n sau khi sinh con ??n lc qu v? thi cho con b. N?u qu v? khng nui con b?ng s?a m?, qu v? s? c kinh nguy?t tr? l?i trong vng 6-8 tu?n sau khi sinh con. CH?M [place] VNG ?Y CH?U Vng ?y ch?u, ho?c ?y ch?u, l m?t b? ph?n c? th? n?m gi?a hai ?i c?a qu v?. Sau khi sinh con, vng ny c?n ch?m [place] ??c bi?t. Tun theo nh?ng h??ng d?n ny theo ch? d?n c?a chuyn gia ch?m [place] s?c kh?e.  T?m b?n n??c ?m trong 15-20 pht.  S? d?ng b?ng t?m thu?c v thu?c x?t v kem gi?m ?au theo ch? d?n.  Khng s? d?ng nt b?ng v? sinh ho?c th?t r?a cho ??n khi m ??o thi ch?y mu.  M?i l?n qu  v? vo nh t?m:  S? d?ng m?t chai phun n??c (peri bottle).  Thay b?ng v? sinh.  S? d?ng m?t kh?n ??t thay gi?y v? sinh cho ??n khi v?t khu c?a qu v? li?n l?i.  T?p bi t?p [place] m?i ngy. Bi t?p [place] gip duy tr cc c? h? tr? cho m ??o, bng quang v ru?t. Qu v? c th? t?p nh?ng bi t?p ny trong khi qu v? ??ng, ng?i hay n?m. ?? t?p cc bi t?p [place]:  Gi? ch?t cc c? ? b?ng v cc c? xung quanh ?ng d?n sinh c?a qu v?.  Gi? trong vi giy.  Th? l?ng.  L?p l?i cho ??n khi qu v? lm 5 l?n lin t?c.  ?? trnh b?nh tr? pht sinh ho?c n?ng thm:  U?ng ?? n??c ?? gi? cho n??c ti?u trong ho?c c mu vng nh?t.  Trnh r?n m?nh  khi ?i ??i ti?n.  Ch? s? d?ng thu?c khng c?n k ??n v thu?c lm m?m phn theo ch? d?n c?a chuyn gia ch?m [place] s?c kh?e. CH?M [place] NG?C  M?c o ng?c v?a kht.  Trnh dng thu?c gi?m ?au khng c?n k ??n ?? ?i?u tr? c?m gic kh ch?u ? ng?c.  Ch??m ? vo ng?c ?? gi?m c?m gic kh ch?u khi c?n thi?t:  Cho ? l?nh vo ti nh?a.  ?? kh?n t?m ? gi?a [place] v ti ch??m.  ?? ? l?nh trong kho?ng 20 pht ho?c theo ch? d?n c?a chuyn gia ch?m [place] s?c kh?e c?a qu v?. [facility] D??NG  p d?ng ch? ?? ?n u?ng cn b?ng.  Khng c? g?ng gi?m cn nhanh b?ng cch gi?m l??ng calo h?p th?.  U?ng vitamin dng tr??c khi sinh cho ??n khi khm s?c kh?e sau sinh ho?c cho ??n khi chuyn gia ch?m [place] s?c kh?e ni qu v? d?ng l?i. TR?M C?M SAU SINH Qu v? c th? th?y mnh khc m [employer] c nguyn nhn r rng v khng th? ??i ph v?i t?t c? nh?ng thay ??i x?y ra do m?i sinh con. Tm tr?ng ny ???c g?i l tr?m c?m sau khi sinh. Tr?m c?m sau khi sinh x?y ra v l??ng hoc mn c?a qu v? thay ??i sau khi sinh con. N?u qu v? b? tr?m c?m sau khi sinh, hy nh?n s? h? tr? c?a ch?ng, b?n b v gia ?nh. N?u tr?m c?m khng t? h?t sau vi tu?n, hy lin h? chuyn gia ch?m [place] s?c kh?e. T? KI?M TRA V T? ki?m tra v hng thng, vo cng th?i ?i?m trong thng. N?u qu v? cho con b s?a m?,  ki?m tra v ngay sau khi cho con b, khi v ? ?? c?ng. N?u qu v? cho con b s?a m? v qu v? b?t ??u c kinh nguy?t, hy ki?m tra v vo ngy 5, 6 ho?c 7 c?a k? kinh nguy?t. Bo co v? b?t k? kh?i u, c?c, hay ti?t d?ch no cho chuyn gia ch?m [place] s?c kh?e. Nn bi?t r?ng v th??ng c u c?c n?u qu v? ?ang cho con b. Vi?c ny l bnh th??ng v ? khng ph?i l m?t nguy c? v? s?c kh?e. Karle Plumber? THN M?T V TNH D?C Trnh quan h? tnh d?c trong t nh?t 3-4 tu?n sau khi sinh con ho?c cho ??n khi ch?t d?ch mu nu ?? ? m ??o h?t hon ton. N?u qu v? mu?n trnh [place], hy s? d?ng m?t s? d?ng trnh [place]. Qu v? c th? c thai sau khi sinh con, ngay c? khi qu v? ch?a th?y kinh nguy?t. ?I KHM N?U:  Qu v? c?m th?y khng th? ??i ph v?i nh?ng thay ??i m m?t ??a tr? mang l?i cho cu?c s?ng c?a qu v?, v nh?ng c?m gic ny khng h?t sau vi tu?n.  Qu v? th?y c u, c?c, ho?c ti?t d?ch ? v. NGAY L?P T?C ?I KHM N?U:  Mu lm ??t b?ng v? sinh trong th?i gian 1 gi? ho?c nhanh h?n.  Qu v? c:  ?au ho?c co th?t r?t nhi?u ? vng b?ng d??i.  Ti?t d?ch c mi kh ch?u ? m ??o.  S?t m khng ki?m sot ???c b?ng thu?c.  S?t v m?t vng trn v b? ?? v ?au.  ?au ho?c ?? ? b?p chn.  ?au ng?c ??t ng?t v r?t nhi?u.  Kh th?.  ?i ti?u ?au ho?c c mu.  Cc  v?n ?? v? th? l?c.  Qu v? nn trong 12 gi? ho?c lu h?n.  Qu v? b? ?au ??u d? d?i.  Qu v? c suy ngh? nghim tr?ng v? vi?c t? lm t?n th??ng mnh, con mnh ho?c b?t k? ng??i no khc. Thng tin ny khng nh?m m?c ?ch thay th? cho l?i khuyn m chuyn gia ch?m [place] s?c kh?e ni v?i qu v?. Hy b?o ??m qu v? ph?i th?o lu?n b?t k? v?n ?? g m qu v? c v?i chuyn gia ch?m [place] s?c kh?e c?a qu v?. Document Released: 12/03/2009 Document Revised: 10/07/2015 Document Reviewed: 12/17/2014 Elsevier Interactive Patient Education  2017 Elsevier Inc.  Storing Breast Milk Breast milk is a living fluid that contains infection-fighting cells  (antibodies). Pre-pumped (expressed) breast milk needs to be stored in a certain way so that it remains effective in protecting your baby against infections. The following guidelines are for storing breast milk for a healthy, full-term infant. How long can breast milk be stored?  Milk can be stored for up to 4 hours at room temperature, or 60-70F (15.6-19.4C). However, it is acceptable to allow the milk to sit for 6-8 hours if the pump parts and containers are well cleaned.  Milk can be stored for 3 days in a refrigerator at less than 12F (3.9C). However, it is acceptable to allow the milk to sit for up to 8 days if the pump parts and containers are well cleaned.  Milk can be stored for 2 weeks in a freezer compartment inside a refrigerator.  Milk can be stored for 3-6 months in a freezer unit with a separate door.  Milk can be stored for 6-12 months in a deep freezer at -70F (-20C). A deep freezer is a chest or stand-alone freezer that is not opened very often and is kept at a colder temperature than a regular freezer. How should I store breast milk?  Milk may be stored in:  A glass container.  A hard, BPA-free plastic container.  A plastic bag specially designed for storing breast milk. Many women like these because they take up less space than other containers and can be attached directly to the breast pump.  Store your milk in 2-4 oz. (60-120 mL) servings. This makes it easier to thaw the milk. It also helps you avoid having to throw out milk that your baby does not drink.  Leave an inch or so at the top of the bag or bottle so the milk has room to expand as it freezes.  Label each container with the date and time the milk was pumped so that you use the milk in the order that it was pumped.  If you will be freezing the milk, freeze it within 24 hours of pumping. Store it in the back of the freezer to prevent the milk from being affected by temperature changes when the freezer  door is opened. How do I thaw frozen breast milk?  Frozen milk can be thawed:  In a refrigerator.  Under warm, running tap water.  In a pan of warm water that has been heated on the stove.  Do not heat milk directly on the stove or in the microwave. The milk will heat unevenly, and it may burn the baby. It will also destroy some of the milks infection-fighting properties.  Thawed milk can be stored in the refrigerator for up to 24 hours, but it should not be refrozen.  If you want to freeze freshly pumped milk  along with milk that is already frozen, take these steps to prevent the fresh milk from thawing out the frozen milk:  Chill the fresh milk in the refrigerator for 30 minutes before adding it to the frozen milk.  Make sure that the amount of fresh milk you add is smaller than the amount of frozen milk. This information is not intended to replace advice given to you by your health care provider. Make sure you discuss any questions you have with your health care provider. Document Released: 04/12/2009 Document Revised: 02/13/2016 Document Reviewed: 01/15/2016 Elsevier Interactive Patient Education  2017 [employer]

## 2016-07-02 ENCOUNTER — Encounter: Payer: Medicaid Other | Admitting: Student

## 2016-07-03 ENCOUNTER — Encounter: Payer: Self-pay | Admitting: Obstetrics and Gynecology

## 2016-07-31 ENCOUNTER — Ambulatory Visit (INDEPENDENT_AMBULATORY_CARE_PROVIDER_SITE_OTHER): Payer: Self-pay | Admitting: Family Medicine

## 2016-07-31 VITALS — BP 110/78 | HR 89 | Temp 98.5°F | Resp 18 | Ht 65.0 in | Wt 116.0 lb

## 2016-07-31 DIAGNOSIS — H60503 Unspecified acute noninfective otitis externa, bilateral: Secondary | ICD-10-CM

## 2016-07-31 DIAGNOSIS — L03213 Periorbital cellulitis: Secondary | ICD-10-CM

## 2016-07-31 MED ORDER — OLOPATADINE HCL 0.2 % OP SOLN: 1.0000 [drp] | Freq: Every day | OPHTHALMIC | 0 refills | Status: DC

## 2016-07-31 MED ORDER — AMOXICILLIN-POT CLAVULANATE 875-125 MG PO TABS: 1.0000 | ORAL_TABLET | Freq: Two times a day (BID) | ORAL | 0 refills | Status: DC

## 2016-07-31 NOTE — Progress Notes (Signed)
Chief Complaint  Patient presents with  . Ear Pain    HPI  She is one month postpartum and is no longer breast feeding Her husband is translating   Presepta cellulitis within the past 24 hours pt started having redness of the upper eyelid on the right Her husband thinks it is from her mascara She used mascara yesterday She reports that she has itching of the right eye She denies vision changes or headaches    Bilateral otalgia- pt has bilateral ear pain start started a week ago No fever or chills No dizziness No tinnitus She does not take any medications except tylenol   Past Medical History:  Diagnosis Date  . Allergy   . Constipation   . Tendonitis of left hip     Current Outpatient Prescriptions  Medication Sig Dispense Refill  . docusate sodium (COLACE) 100 MG capsule Take 1 capsule (100 mg total) by mouth 2 (two) times daily. 30 capsule 0  . ibuprofen (ADVIL,MOTRIN) 600 MG tablet Take 1 tablet (600 mg total) by mouth every 6 (six) hours. 30 tablet 0  . Prenatal Vit w/Fe-Methylfol-FA (PNV PO) Take 1 tablet by mouth daily.     Marland Kitchen amoxicillin-clavulanate (AUGMENTIN) 875-125 MG tablet Take 1 tablet by mouth 2 (two) times daily. 20 tablet 0  . Olopatadine HCl 0.2 % SOLN Apply 1 drop to eye daily. 2.5 mL 0   No current facility-administered medications for this visit.     Allergies:  Allergies  Allergen Reactions  . Acetaminophen Itching and Rash    History reviewed. No pertinent surgical history.  Social History   Social History  . Marital status: Married    Spouse name: N/A  . Number of children: N/A  . Years of education: N/A   Social History Main Topics  . Smoking status: Never Smoker  . Smokeless tobacco: Never Used  . Alcohol use No  . Drug use: No  . Sexual activity: Yes   Other Topics Concern  . None   Social History Narrative  . None    ROS  Objective: Vitals:   07/31/16 1337  BP: 110/78  Pulse: 89  Resp: 18  Temp: 98.5 F (36.9  C)  TempSrc: Oral  SpO2: 99%  Weight: 116 lb (52.6 kg)  Height: 5\' 5"  (1.651 m)    Physical Exam  Constitutional: She is oriented to person, place, and time. She appears well-developed and well-nourished.  HENT:  Head: Normocephalic and atraumatic.  Right Ear: There is drainage. Tympanic membrane is injected. A middle ear effusion is present.  Left Ear: There is drainage. Tympanic membrane is injected. A middle ear effusion is present.  Eyes: Conjunctivae and EOM are normal. Lids are everted and swept, no foreign bodies found.    Neck: Normal range of motion. No thyromegaly present.  Cardiovascular: Normal rate, regular rhythm and normal heart sounds.   No murmur heard. Pulmonary/Chest: Effort normal and breath sounds normal. No respiratory distress.  Neurological: She is alert and oriented to person, place, and time.  Psychiatric: She has a normal mood and affect. Her behavior is normal. Judgment and thought content normal.     Assessment and Plan Kaiulani Sitton was seen today for ear pain.  Diagnoses and all orders for this visit:  Acute otitis externa of both ears, unspecified type- since pt is not breastfeeding will use augmentin Discussed that she could take tylenol and ibuprofen for pain -     amoxicillin-clavulanate (AUGMENTIN) 875-125 MG tablet; Take 1  tablet by mouth 2 (two) times daily.  Preseptal cellulitis of right upper eyelid- advised augmentin antibiotic for infection and pataday drops as needed for itching Explained to use warm compress to soothe the eye -     amoxicillin-clavulanate (AUGMENTIN) 875-125 MG tablet; Take 1 tablet by mouth 2 (two) times daily. -     Olopatadine HCl 0.2 % SOLN; Apply 1 drop to eye daily.     Ruthell Feigenbaum A Remigio Mcmillon

## 2016-07-31 NOTE — Patient Instructions (Addendum)
   IF you received an x-ray today, you will receive an invoice from [facility]. Please contact [facility] at [phone] with questions or concerns regarding your invoice.   IF you received labwork today, you will receive an invoice from [facility]. Please contact [facility] at [phone] with questions or concerns regarding your invoice.   Our billing staff will not be able to assist you with questions regarding bills from these companies.  You will be contacted with the lab results as soon as they are available. The fastest way to get your results is to activate your My Chart account. Instructions are located on the last page of this paperwork. If you have not heard from us regarding the results in 2 weeks, please contact this office.      Vim tai ngoi (Otitis Externa) Vim tai ngoi l m?t b?nh nhi?m trng ?ng tai ngoi. ?ng tai ngoi l vng gi?a tai ngoi v mng nh?. Vim tai ngoi ?i khi ???c g?i l "tai ng??i ?i b?i." NGUYN NHN Tnh tr?ng ny c th? do:  B?i l?i ? vng n??c b?n.  ?m ??t trong tai.  M?t th??ng t?n bn trong tai.  M?t v?t t?c trong tai.  V?t c?t ho?c v?t x??c bn ngoi tai. CC Y?U T? NGUY C? Tnh tr?ng ny hay x?y ra h?n ? nh?ng ng??i ?i b?i. TRI?U CH?NG Tri?u ch?ng ??u tin c?a tnh tr?ng ny th??ng l ng?a trong tai. Cc d?u hi?u v tri?u ch?ng sau ? bao g?m:  S?ng ? tai.  T?y ?? ? tai.  ?au tai. ?au c th? n?ng h?n khi qu v? ko tai.  M? t? tai ch?y ra. CH?N ?ON Tnh tr?ng ny c th? ???c ch?n ?on b?ng cch khm tai v xt nghi?m ch?t d?ch ? tai ?? tm vi khu?n v n?m. ?I?U TR? Tnh tr?ng ny c th? ???c ?i?u tr? b?ng:  Thu?c khng sinh nh? tai. Nh?ng thu?c ny th??ng ???c cho dng trong 10-14 ngy.  Thu?c ?? gi?m ng?a v s?ng. H??NG D?N CH?M SC T?I NH  N?u qu v? ???c k thu?c nh? tai khng sinh, hy nh? thu?c theo ch? d?n c?a chuyn gia ch?m sc s?c kh?e. Khng d?ng s? d?ng thu?c khng sinh ngay c? khi  tnh tr?ng c?a qu v? c?i thi?n.  Ch? s? d?ng thu?c khng c?n k ??n v thu?c c?n k ??n theo ch? d?n c?a chuyn gia ch?m sc s?c kh?e.  Tun th? t?t c? cc cu?c h?n khm l?i theo ch? d?n c?a chuyn gia ch?m sc s?c kh?e. ?i?u ny c vai tr quan tr?ng. PHNG NG?A  Gi? tai kh. S? d?ng gc c?a kh?n t?m ?? lm kh tai sau khi qu v? b?i ho?c t?m.  Trnh gi ho?c nht cc v?t vo bn trong tai. Lm nh? th? c th? gy t?n th??ng ?ng tai ho?c lm m?t l?p sp b?o v? ?ng tai, do ? vi khu?n v n?m d? pht tri?n h?n.  Trnh b?i ? h?, ch? n??c  nhi?m, ho?c cc b? b?i m c th? khng c ?ng l??ng clo c?n thi?t.  Cn nh?c vi?c lm thu?c nh? tai v nh? 3 ho?c 4 gi?t vo m?i tai sau khi b?i. Hy h?i chuyn gia ch?m sc s?c kh?e cch lm thu?c nh? tai. ?I KHM N?U:  Qu v? b? s?t.  Sau 3 ngy tai qu v? v?n ??, s?ng, ?au, ho?c ch?y m?.  Ch? t?y ??, s?ng, ho?c ?au tr? nn tr?m tr?ng h?n.    Qu v? b? ?au ??u r?t nhi?u.  Qu v? b? t?y ??, s?ng, ?au, ho?c nh?y c?m ?au ? vng pha sau tai. Thng tin ny khng nh?m m?c ?ch thay th? cho l?i khuyn m chuyn gia ch?m sc s?c kh?e ni v?i qu v?. Hy b?o ??m qu v? ph?i th?o lu?n b?t k? v?n ?? g m qu v? c v?i chuyn gia ch?m sc s?c kh?e c?a qu v?. Document Released: 06/15/2005 Document Revised: 07/06/2014 Document Reviewed: 03/25/2015 Elsevier Interactive Patient Education  2017 Elsevier Inc.  

## 2016-08-10 ENCOUNTER — Encounter: Payer: Self-pay | Admitting: Obstetrics and Gynecology

## 2016-08-10 ENCOUNTER — Ambulatory Visit (INDEPENDENT_AMBULATORY_CARE_PROVIDER_SITE_OTHER): Payer: Medicaid Other | Admitting: Obstetrics and Gynecology

## 2016-08-10 NOTE — Progress Notes (Signed)
Subjective:   [employer] # [phone]  Erika DaltonLi Nai Viviann SpareKra Alvarez is a 21 y.o. female who presents for a postpartum visit. She is 6 weeks postpartum following a spontaneous vaginal delivery. I have fully reviewed the prenatal and intrapartum course. The delivery was at 39 and 4 gestational weeks. Outcome: spontaneous vaginal delivery. Anesthesia: none. Postpartum course has been unremakable. Baby's course has been unremarkabel. Baby is feeding by both breast and bottle - Similac Advance. Bleeding no bleeding. Bowel function is normal. Bladder function is normal. Patient is not sexually active. Contraception method is condoms. Postpartum depression screening: negative.  The following portions of the patient's history were reviewed and updated as appropriate: past family history and past social history.  Review of Systems Pertinent items are noted in HPI.   Objective:    LMP 07/23/2016   General:  alert, cooperative and appears stated age   Breasts:  not examined in lactating female  Lungs: clear to auscultation bilaterally  Heart:  regular rate and rhythm, S1, S2 normal, no murmur, click, rub or gallop  Abdomen: soft, non-tender; bowel sounds normal; no masses,  no organomegaly   Vulva:  normal, well healed pernineal laceration  Vagina: normal vagina  Cervix:  not evaluated  Corpus: not examined  Adnexa:  not evaluated  Rectal Exam: Not performed.        Assessment:     Normal postpartum exam. Pap smear not done at today's visit. Not indicated under 21 y/o  Plan:    1. Contraception: condoms 2.  Normal post partum care. Healing 2nd degree laceration 3. Follow up in: 1 year or as needed.

## 2016-08-10 NOTE — Patient Instructions (Signed)
Contraception Choices Birth control (contraception) is the use of any methods or devices to stop pregnancy from happening. Below are some methods to help avoid pregnancy. Hormonal birth control  A small tube put under the skin of the upper arm (implant). The tube can stay in place for 3 years. The implant must be taken out after 3 years.  Shots given every 3 months.  Pills taken every day.  Patches that are changed once a week.  A ring put into the vagina (vaginal ring). The ring is left in place for 3 weeks and removed for 1 week. Then, a new ring is put in the vagina.  Emergency birth control pills taken after unprotected sex (intercourse). Barrier birth control  A thin covering worn on the penis (female condom) during sex.  A soft, loose covering put into the vagina (female condom) before sex.  A rubber bowl that sits over the cervix (diaphragm). The bowl must be made for you. The bowl is put into the vagina before sex. The bowl is left in place for 6 to 8 hours after sex.  A small, soft cup that fits over the cervix (cervical cap). The cup must be made for you. The cup can be left in place for 48 hours after sex.  A sponge that is put into the vagina before sex.  A chemical that kills or stops sperm from getting into the cervix and uterus (spermicide). The chemical may be a cream, jelly, foam, or pill. Intrauterine (IUD) birth control  IUD birth control is a small, T-shaped piece of plastic. The plastic is put inside the uterus. There are 2 types of IUD:  Copper IUD. The IUD is covered in copper wire. The copper makes a fluid that kills sperm. It can stay in place for 10 years.  Hormone IUD. The hormone stops pregnancy from happening. It can stay in place for 5 years. Permanent methods  When the woman has her fallopian tubes sealed, tied, or blocked during surgery. This stops the egg from traveling to the uterus.  The doctor places a small coil or insert into each fallopian  tube. This causes scar tissue to form and blocks the fallopian tubes.  When the female has the tubes that carry sperm tied off (vasectomy). Natural family planning birth control  Natural family planning means not having sex or using barrier birth control on the days the woman could become pregnant.  Use a calendar to keep track of the length of each period and know the days she can get pregnant.  Avoid sex during ovulation.  Use a thermometer to measure body temperature. Also watch for symptoms of ovulation.  Time sex to be after the woman has ovulated. Use condoms to help protect yourself against sexually transmitted infections (STIs). Do this no matter what type of birth control you use. Talk to your doctor about which type of birth control is best for you. This information is not intended to replace advice given to you by your health care provider. Make sure you discuss any questions you have with your health care provider. Document Released: 04/12/2009 Document Revised: 11/21/2015 Document Reviewed: 01/04/2013 Elsevier Interactive Patient Education  2017 Elsevier Inc.  

## 2016-08-25 ENCOUNTER — Ambulatory Visit (INDEPENDENT_AMBULATORY_CARE_PROVIDER_SITE_OTHER): Payer: Self-pay | Admitting: Physician Assistant

## 2016-08-25 VITALS — BP 110/80 | HR 91 | Temp 97.5°F | Resp 16 | Ht 65.0 in | Wt 110.8 lb

## 2016-08-25 DIAGNOSIS — H6983 Other specified disorders of Eustachian tube, bilateral: Secondary | ICD-10-CM

## 2016-08-25 DIAGNOSIS — H60392 Other infective otitis externa, left ear: Secondary | ICD-10-CM

## 2016-08-25 MED ORDER — FLUTICASONE PROPIONATE 50 MCG/ACT NA SUSP: 2.0000 | Freq: Every day | NASAL | 0 refills | Status: DC

## 2016-08-25 MED ORDER — NEOMYCIN-POLYMYXIN-HC 3.5-10000-1 OT SOLN: 3.0000 [drp] | Freq: Four times a day (QID) | OTIC | 0 refills | Status: AC

## 2016-08-25 NOTE — Patient Instructions (Addendum)
  Use the ear dropps in the left ear for 7 days  Use the nose spray for a month to help with the ear congestion and the trouble you are having with hearing.   IF you received an x-ray today, you will receive an invoice [facility]. Please contact [facility] at [phone] with questions or concerns regarding your invoice.   IF you received labwork today, you will receive an invoice from [facility]. Please contact [facility] at [phone] with questions or concerns regarding your invoice.   Our billing staff will not be able to assist you with questions regarding bills from these companies.  You will be contacted with the lab results as soon as they are available. The fastest way to get your results is to activate your My Chart account. Instructions are located on the last page of this paperwork. If you have not heard from us regarding the results in 2 weeks, please contact this office.

## 2016-08-25 NOTE — Progress Notes (Signed)
   Erika BoehringerLi Nai [name]  MRN: [id number] DOB: 1996-05-08  Subjective:  Pt presents to clinic with bilateral ear pain but the left side is worse than the right side.  She was seen on 2/2 and diagnosed with otitis externa and she felt like when she on on the abx it was better but it did not resolve.  Her ears are itchy and painful and trouble hearing. She is currently having mild cough.  She has done nothing at home for her symptoms.  Faily member interprets  Review of Systems  HENT: Positive for ear pain (L>R) and hearing loss (muffled). Negative for ear discharge.   Respiratory: Positive for cough (mild).     Patient Active Problem List   Diagnosis Date Noted  . Language barrier 05/27/2016    Current Outpatient Prescriptions on File Prior to Visit  Medication Sig Dispense Refill  . Prenatal Vit w/Fe-Methylfol-FA (PNV PO) Take 1 tablet by mouth daily.      No current facility-administered medications on file prior to visit.     Allergies  Allergen Reactions  . Acetaminophen Itching and Rash    Pt patients past, family and social history were reviewed and updated.   Objective:  BP 110/80 (BP Location: Right Arm, Patient Position: Sitting, Cuff Size: Small)   Pulse 91   Temp 97.5 F (36.4 C) (Oral)   Resp 16   Ht 5\' 5"  (1.651 m)   Wt 110 lb 12.8 oz (50.3 kg)   LMP 07/26/2016 (Exact Date)   SpO2 100%   Breastfeeding? No   BMI 18.44 kg/m   Physical Exam  Constitutional: She is oriented to person, place, and time and well-developed, well-nourished, and in no distress.  HENT:  Head: Normocephalic and atraumatic.  Right Ear: Hearing, external ear and ear canal normal. A middle ear effusion (serous') is present.  Left Ear: Hearing and ear canal normal. There is drainage (thick white discharge in ear canal - mild erythema and swelling). A middle ear effusion (serous) is present.  Nose: Nose normal.  Mouth/Throat: Uvula is midline, oropharynx is clear and moist and mucous  membranes are normal.  Eyes: Conjunctivae are normal.  Neck: Normal range of motion.  Cardiovascular: Normal rate, regular rhythm and normal heart sounds.   No murmur heard. Pulmonary/Chest: Effort normal and breath sounds normal.  Neurological: She is alert and oriented to person, place, and time. Gait normal.  Skin: Skin is warm and dry.  Psychiatric: Mood, memory and judgment normal. She has a flat affect.  Vitals reviewed.   Assessment and Plan :  Infective otitis externa of left ear - Plan: neomycin-polymyxin-hydrocortisone (CORTISPORIN) otic solution, Care order/instruction:  Dysfunction of both eustachian tubes - Plan: fluticasone (FLONASE) 50 MCG/ACT nasal spray  Benny LennertSarah Weber PA-C  Primary Care at [facility] 08/25/2016 9:32 AM

## 2016-09-01 IMAGING — US US MFM OB DETAIL+14 WK
1 series · 13 of 28 positions shown · non-contrast
Comparison: none

Hospital Clinic-
Faculty Physician
OB/Gyn Clinic
MINNIE

1  RISAS VIAN            [id number]      [id number]     [id number]
Indications
17 weeks gestation of pregnancy
Detailed fetal anatomic survey                 Z36
Fetal abnormality - other known or
suspected (bilateral choriod plexus cyst, EIF)
OB History
Gravidity:    1         Term:   0        Prem:   0        SAB:   0
TOP:          0       Ectopic:  0        Living: 0
Fetal Evaluation
Num Of Fetuses:     1
Fetal Heart         157
Rate(bpm):
Cardiac Activity:   Observed
Presentation:       Variable
Placenta:           Posterior, above cervical os
P. Cord Insertion:  Visualized, central
Amniotic Fluid
AFI FV:      Subjectively within normal limits
Largest Pocket(cm)
4.22
Biometry
BPD:      36.5  mm     G. Age:  17w 1d         32  %    CI:        70.58   %   70 - 86
FL/HC:      18.1   %   15.8 - 18
HC:      138.5  mm     G. Age:  17w 2d         25  %    HC/AC:      1.10       1.07 -
AC:      125.8  mm     G. Age:  18w 1d         69  %    FL/BPD:     68.5   %
FL:         25  mm     G. Age:  17w 4d         45  %    FL/AC:      19.9   %   20 - 24
HUM:      24.4  mm     G. Age:  17w 4d         57  %
CER:      16.7  mm     G. Age:  16w 5d         30  %
NFT:       4.9  mm
CM:        5.4  mm
Est. FW:     210  gm      0 lb 7 oz     55  %
Gestational Age
LMP:           17w 4d       Date:   09/22/15                 EDD:   06/28/16
U/S Today:     17w 4d                                        EDD:   06/28/16
Best:          17w 4d    Det. By:   LMP  (09/22/15)          EDD:   06/28/16
Anatomy
Cranium:               Appears normal         LVOT:                   Appears normal
Cavum:                 Appears normal         Aortic Arch:            Appears normal
Ventricles:            Appears normal         Ductal Arch:            Appears normal
Choroid Plexus:        Bilateral choroid      Diaphragm:              Appears normal
plexus cysts
Cerebellum:            Appears normal         Stomach:                Appears normal, left
sided
Posterior Fossa:       Appears normal         Abdomen:                Appears normal
Nuchal Fold:           Appears normal         Abdominal Wall:         Appears nml (cord
insert, abd wall)
Face:                  Appears normal         Cord Vessels:           Appears normal (3
(orbits and profile)                           vessel cord)
Lips:                  Appears normal         Kidneys:                Appear normal
Palate:                Appears normal         Bladder:                Appears normal
Thoracic:              Appears normal         Spine:                  Limited views
appear normal
Heart:                 Echogenic focus        Upper Extremities:      Appears normal
in LV
RVOT:                  Appears normal         Lower Extremities:      Appears normal
Other:  Fetus appears to be a male. Heels and 5th digit visualized. Nasal
bone visualized. Technically difficult due to fetal position.
Cervix Uterus Adnexa
Cervix
Length:           3.44  cm.
Normal appearance by transabdominal scan.
Uterus
No abnormality visualized.
Left Ovary
Not visualized.
Right Ovary
Within normal limits.
Cul De Sac:   No free fluid seen.
Adnexa:       No abnormality visualized.
Impression
INDICATION: 19 yr old G1P0 at 31w0d for fetal anatomic survey.

[Series 1: us mfm ob detail+14 wk · 160 acquisitions, 13 frames shown]
[im 6/160]
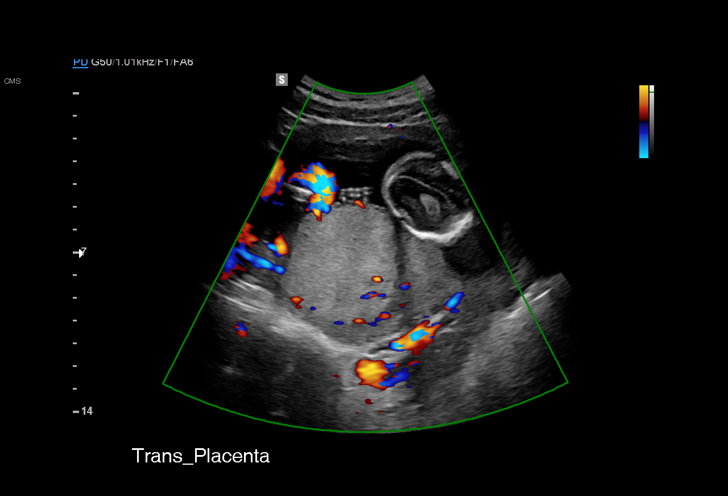
[im 18/160]
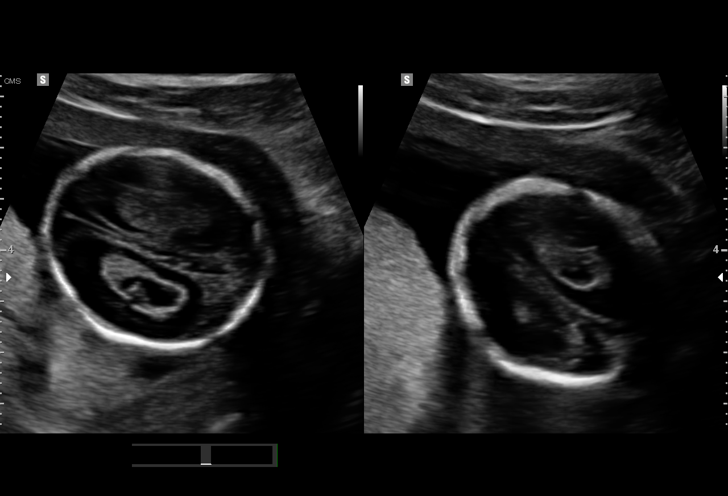
[im 30/160]
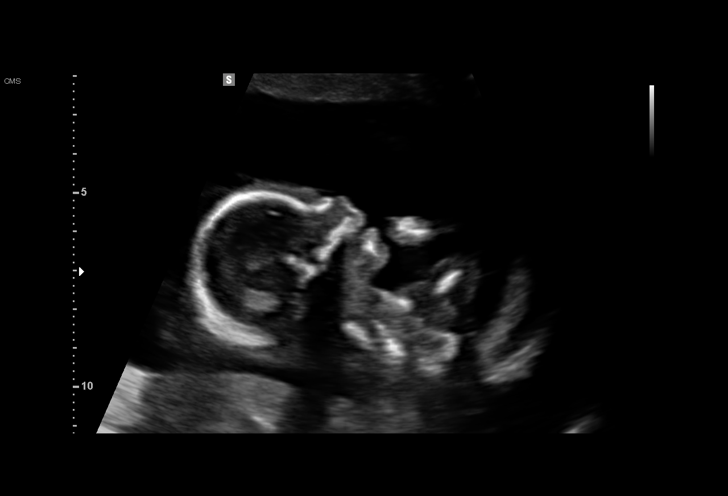
[im 42/160]
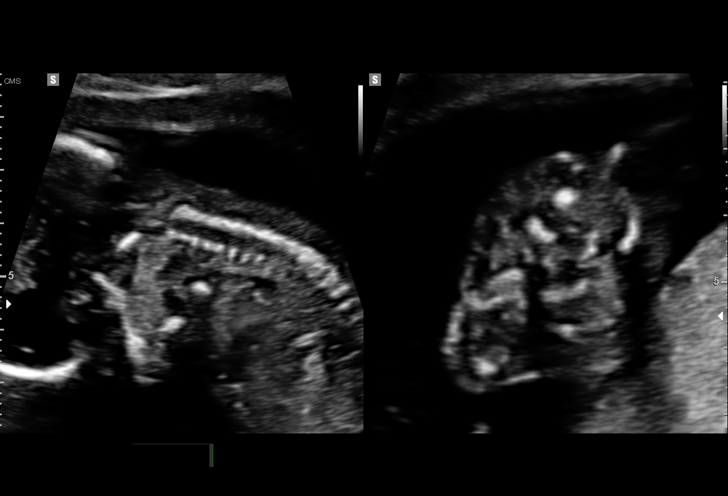
[im 54/160]
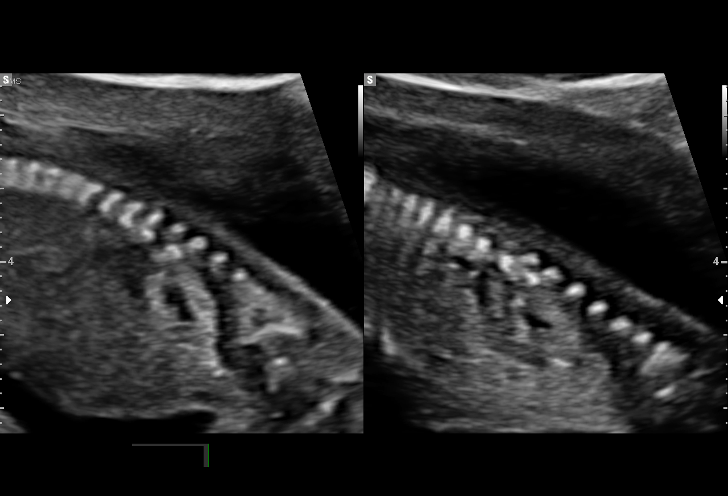
[im 65/160]
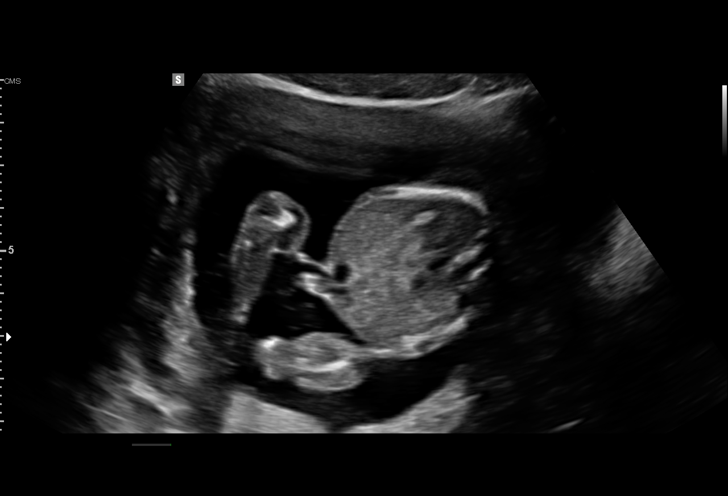
[im 83/160]
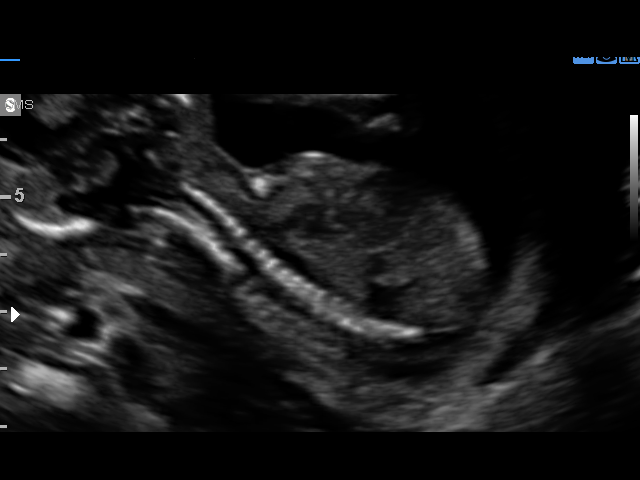
[im 95/160]
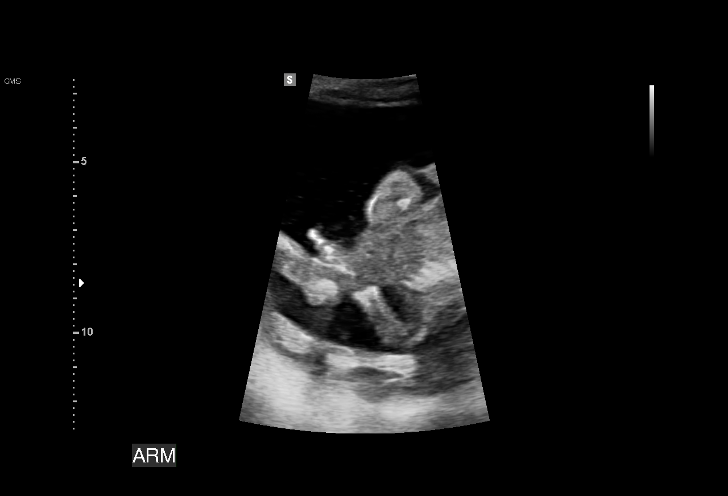
[im 107/160]
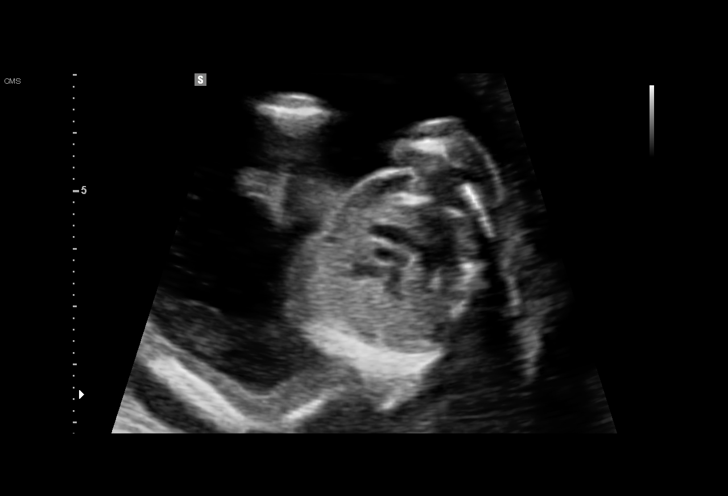
[im 118/160]
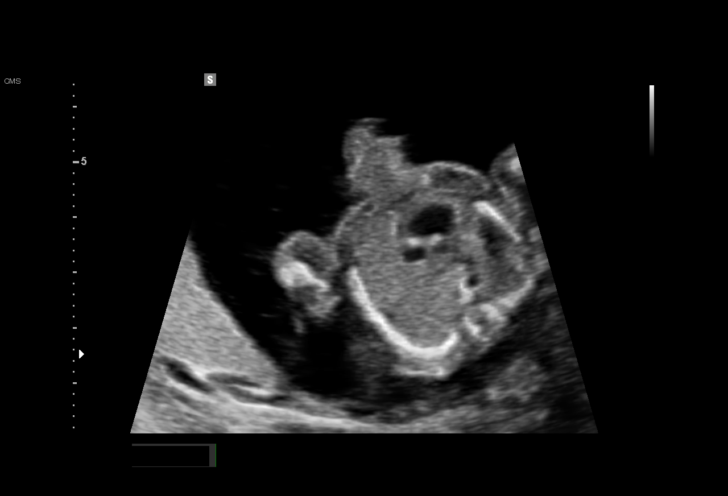
[im 130/160]
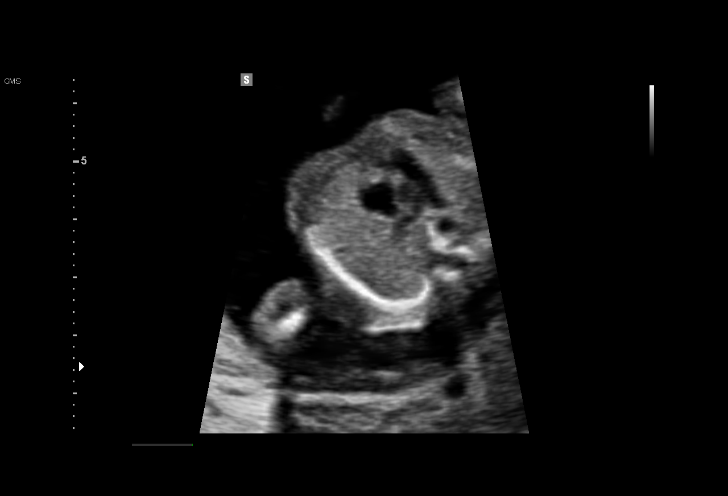
[im 142/160]
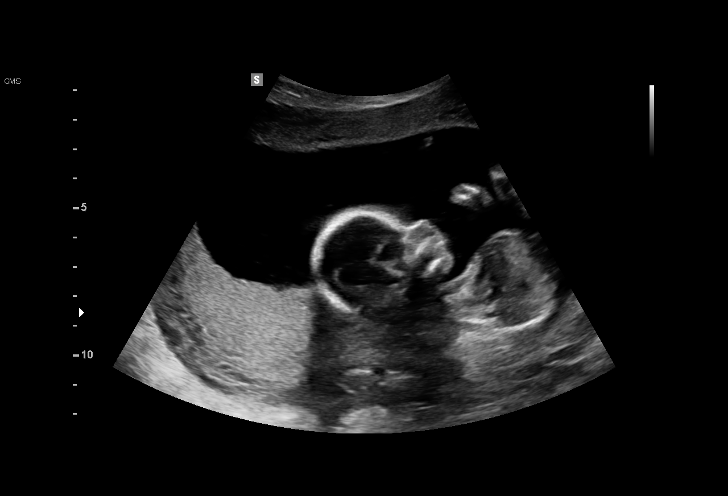
[im 154/160]
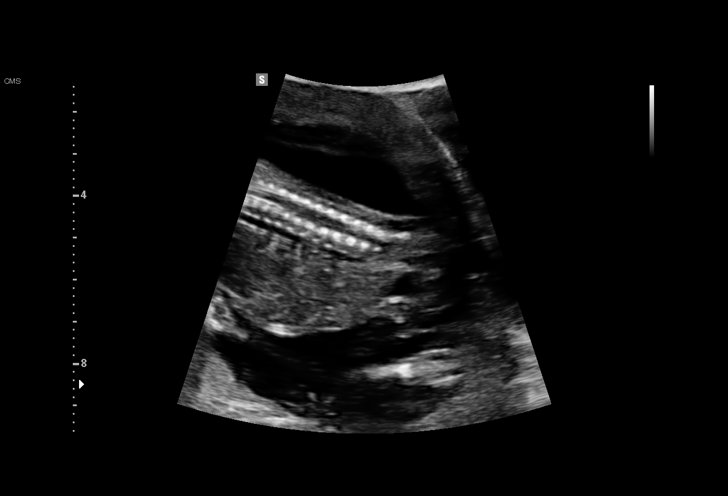

[13 of 28 positions shown; findings below may reference images not displayed]

FINDINGS: 1. Single intrauterine pregnancy.
2. Fetal biometry is consistent with dating.
3. Posterior placenta without evidence of previa.
4. Normal amniotic fluid volume.
5. Normal transabdominal cervical length.
6. The views of the spine are limited.
7. There is an echogenic focus in the left ventricle.
8. There are bilateral choroid plexus cysts.
9. The remainder of the limited anatomy survey is normal.
Recommendations

1. Appropriate fetal growth.
2. Limited anatomy survey:
- recommend follow up in 4 weeks to complete anatomic
survey
3. Echogenic focus in the left ventricle and choroid plexus
cysts:
- patient met with the genetic counselor; see separate report
- after counseling patient declined aneuploidy screening

## 2017-02-18 ENCOUNTER — Encounter: Payer: Self-pay | Admitting: Physician Assistant

## 2017-02-18 ENCOUNTER — Ambulatory Visit (INDEPENDENT_AMBULATORY_CARE_PROVIDER_SITE_OTHER): Payer: BLUE CROSS/BLUE SHIELD | Admitting: Family Medicine

## 2017-02-18 VITALS — BP 109/71 | HR 74 | Temp 98.8°F | Resp 18 | Ht 63.78 in | Wt 101.1 lb

## 2017-02-18 DIAGNOSIS — R1011 Right upper quadrant pain: Secondary | ICD-10-CM

## 2017-02-18 DIAGNOSIS — K5900 Constipation, unspecified: Secondary | ICD-10-CM

## 2017-02-18 DIAGNOSIS — H6983 Other specified disorders of Eustachian tube, bilateral: Secondary | ICD-10-CM

## 2017-02-18 DIAGNOSIS — R1013 Epigastric pain: Secondary | ICD-10-CM | POA: Diagnosis not present

## 2017-02-18 LAB — COMPREHENSIVE METABOLIC PANEL
ALT: 10 IU/L (ref 0–32)
AST: 16 IU/L (ref 0–40)
Albumin/Globulin Ratio: 1.4 (ref 1.2–2.2)
Albumin: 4.6 g/dL (ref 3.5–5.5)
Alkaline Phosphatase: 40 IU/L (ref 39–117)
BUN/Creatinine Ratio: 11 (ref 9–23)
BUN: 8 mg/dL (ref 6–20)
Bilirubin Total: 1 mg/dL (ref 0.0–1.2)
CO2: 21 mmol/L (ref 20–29)
Calcium: 9.9 mg/dL (ref 8.7–10.2)
Chloride: 103 mmol/L (ref 96–106)
Creatinine, Ser: 0.71 mg/dL (ref 0.57–1.00)
GFR calc Af Amer: 142 mL/min/{1.73_m2} (ref >59)
GFR calc non Af Amer: 123 mL/min/{1.73_m2} (ref >59)
Globulin, Total: 3.2 g/dL (ref 1.5–4.5)
Glucose: 82 mg/dL (ref 65–99)
Potassium: 4.5 mmol/L (ref 3.5–5.2)
Sodium: 138 mmol/L (ref 134–144)
Total Protein: 7.8 g/dL (ref 6.0–8.5)

## 2017-02-18 LAB — CBC WITH DIFFERENTIAL/PLATELET
Basophils Absolute: 0 10*3/uL (ref 0.0–0.2)
Basos: 1 %
EOS (ABSOLUTE): 0.1 10*3/uL (ref 0.0–0.4)
Eos: 1 %
Hematocrit: 39.2 % (ref 34.0–46.6)
Hemoglobin: 12.9 g/dL (ref 11.1–15.9)
Immature Grans (Abs): 0 10*3/uL (ref 0.0–0.1)
Immature Granulocytes: 0 %
Lymphocytes Absolute: 1.5 10*3/uL (ref 0.7–3.1)
Lymphs: 35 %
MCH: 28.5 pg (ref 26.6–33.0)
MCHC: 32.9 g/dL (ref 31.5–35.7)
MCV: 87 fL (ref 79–97)
Monocytes Absolute: 0.2 10*3/uL (ref 0.1–0.9)
Monocytes: 4 %
Neutrophils Absolute: 2.5 10*3/uL (ref 1.4–7.0)
Neutrophils: 59 %
Platelets: 227 10*3/uL (ref 150–379)
RBC: 4.53 x10E6/uL (ref 3.77–5.28)
RDW: 13.7 % (ref 12.3–15.4)
WBC: 4.3 10*3/uL (ref 3.4–10.8)

## 2017-02-18 MED ORDER — POLYETHYLENE GLYCOL 3350 17 GM/SCOOP PO POWD: 17.0000 g | Freq: Every day | ORAL | 0 refills | Status: AC

## 2017-02-18 MED ORDER — FLUTICASONE PROPIONATE 50 MCG/ACT NA SUSP: 2.0000 | Freq: Every day | NASAL | 0 refills | Status: AC

## 2017-02-18 NOTE — Progress Notes (Signed)
8/23/201811:36 AM  Erika Alvarez Jan 1996-01-29, 20 y.o. female [id number]  Chief Complaint  Patient presents with  . Abdominal Pain    X 3 days  . Ear Pain    X 2-3 mth    HPI:   Patient is a 21 y.o. female who presents today with 2 concerns.  Patient is vietnamese speaking and desires to use her husband as interpreter.   1. 3 days of constant sharp abd pain, started epigastric region, now stronger in RUQ area, not made better or worse with eating, a/w nausea, heartburn. Denies vomiting, bloating, black tarry stools, denies any h/o ulcers or gi surgeries. Denies any similar sx in the past. Has not taken any medication for this. Has chronic constipation, goes about once a week, small round stools, painful, sometimes scant red blood. Used to take medication for constipation in [place], none here.  2. 2-3 months of mostly left ear pain- pressure, decreased hearing, no drainage, treated multiple times for infection, no fever or chills, + nasal congestion, sneezing, post-nasal drip, not taking any medication.  Depression screen [facility] 2/9 02/18/2017 08/25/2016 07/31/2016  Decreased Interest 0 0 0  Down, Depressed, Hopeless 0 0 0  PHQ - 2 Score 0 0 0  Altered sleeping - - -  Tired, decreased energy - - -  Change in appetite - - -  Feeling bad or failure about yourself  - - -  Trouble concentrating - - -  Moving slowly or fidgety/restless - - -  Suicidal thoughts - - -  PHQ-9 Score - - -    Allergies  Allergen Reactions  . Acetaminophen Itching and Rash    Current Outpatient Prescriptions on File Prior to Visit  Medication Sig Dispense Refill  . Prenatal Vit w/Fe-Methylfol-FA (PNV PO) Take 1 tablet by mouth daily.      No current facility-administered medications on file prior to visit.     Past Medical History:  Diagnosis Date  . Allergy   . Constipation   . Tendonitis of left hip     History reviewed. No pertinent surgical history.  Social History  Substance Use  Topics  . Smoking status: Never Smoker  . Smokeless tobacco: Never Used  . Alcohol use No    Family History  Problem Relation Age of Onset  . Cirrhosis Mother   . Colitis Father     Review of Systems  Constitutional: Negative for chills, fever and malaise/fatigue.  HENT: Positive for congestion, ear pain and hearing loss. Negative for ear discharge, sinus pain and sore throat.   Respiratory: Negative for cough and shortness of breath.   Cardiovascular: Negative for chest pain, palpitations and leg swelling.  Gastrointestinal: Positive for abdominal pain, blood in stool, constipation and nausea. Negative for diarrhea, melena and vomiting.     OBJECTIVE:  Blood pressure 109/71, pulse 74, temperature 98.8 F (37.1 C), temperature source Oral, resp. rate 18, height 5' 3.78" (1.62 m), weight 101 lb 1.6 oz (45.9 kg), last menstrual period 01/07/2017, SpO2 100 %, not currently breastfeeding.  Physical Exam  Constitutional: She is oriented to person, place, and time and well-developed, well-nourished, and in no distress.  HENT:  Head: Normocephalic and atraumatic.  Right Ear: Hearing, tympanic membrane, external ear and ear canal normal.  Left Ear: Hearing, external ear and ear canal normal. Tympanic membrane is retracted. Tympanic membrane is not erythematous.  No middle ear effusion.  Nose: Mucosal edema present.  Mouth/Throat: Oropharynx is clear and moist.  Eyes:  Pupils are equal, round, and reactive to light. EOM are normal.  Neck: Neck supple.  Cardiovascular: Normal rate, regular rhythm and normal heart sounds.  Exam reveals no gallop and no friction rub.   No murmur heard. Pulmonary/Chest: Effort normal and breath sounds normal. She has no wheezes. She has no rales.  Abdominal: Soft. Bowel sounds are normal. She exhibits no distension and no mass. There is tenderness (RUQ > epigastric, equivical murphys). There is no rebound and no guarding.  Lymphadenopathy:    She has no  cervical adenopathy.  Neurological: She is alert and oriented to person, place, and time. Gait normal.  Skin: Skin is warm and dry.    ASSESSMENT and PLAN:  1. Abdominal pain, epigastric Discussed ddx, testing for h  Pylori, discussed OTC antiacids such as tums, mylanta for now,  - H. pylori breath test  2. Continuous RUQ abdominal pain Discussed ddx. Ordering labs, exam suggestive of GB, ordering [place]. RTC precautions discussed - CBC with Differential - Comprehensive metabolic panel - US Abdomen Limited RUQ; Future  3. Constipation, unspecified constipation type Discussed new medication, rx miralax.   4. Dysfunction of both eustachian tubes Discussed diagnosis, treatment, flonase refilled - fluticasone (FLONASE) 50 MCG/ACT nasal spray; Place 2 sprays into both nostrils daily.  Dispense: 16 g; Refill: 0       Myles Lipps, MD Primary Care at [facility] [address] [place], [place] [address] Ph.  [phone] Fax [phone]

## 2017-02-18 NOTE — Patient Instructions (Addendum)
IF you received an x-ray today, you will receive an invoice from [facility]. Please contact [facility] at [phone] with questions or concerns regarding your invoice.   IF you received labwork today, you will receive an invoice from [facility]. Please contact [facility] at [phone] with questions or concerns regarding your invoice.   Our billing staff will not be able to assist you with questions regarding bills from these companies.  You will be contacted with the lab results as soon as they are available. The fastest way to get your results is to activate your My Chart account. Instructions are located on the last page of this paperwork. If you have not heard from [place] regarding the results in 2 weeks, please contact this office.     To bn, Ng??i l?n (Constipation, Adult) To bn l khi m?t ng??i ?i ??i ti?n trong m?t tu?n t h?n so v?i bnh th??ng, ??i ti?n kh kh?n, ho?c ??i ti?n ra phn kh, c?ng, ho?c to h?n bnh th??ng. To bn c th? do m?t tnh tr?ng bn trong gy ra. N c th? tr? ln tr?m tr?ng h?n theo ?? tu?i n?u m?t ng??i dng cc lo?i thu?c nh?t ??nh v khng u?ng ?? n??c. H??NG D?N CH?M [place] T?I NH ?n v u?ng  ?n th?c ?n c nhi?u ch?t x? nh? tri cy t??i v rau, ng? c?c nguyn h?t v cc lo?i ??u.  H?n ch? th?c ?n c nhi?u ch?t bo, t ch?t x?, ho?c ch? bi?n s?n qu m?c, ch?ng h?n khoai ty chin, bnh hamburger, bnh quy, k?o v soda.  U?ng ?? n??c ?? gi? cho n??c ti?u trong ho?c c mu vng nh?t. H??ng d?n chung  T?p th? d?c th??ng xuyn theo ch? d?n c?a chuyn gia ch?m [place] s?c kh?e.  ?i v? sinh ngay khi qu v? c nhu c?u. Khng nh?n ?i ??i ti?n.  Ch? s? d?ng thu?c khng c?n k ??n v thu?c c?n k ??n theo ch? d?n c?a chuyn gia ch?m [place] s?c kh?e. Cc thu?c ny bao g?m b?t k? th?c ph?m ch?c n?ng c ch?t x? no.  Th?c hnh cc bi t?p luy?n l?i c? ?y ch?u, ch?ng h?n nh? th? su trong lc th? gin b?ng d??i v th? gin ph?n ?y ch?u trong  khi ??i ti?n.  Theo di tnh tr?ng c?a qu v? ?? pht hi?n b?t k? thay ??i no.  Tun th? t?t c? cc cu?c h?n khm l?i theo ch? d?n c?a chuyn gia ch?m [place] s?c kh?e. ?i?u ny c vai tr quan tr?ng. ?I KHM N?U:  Qu v? b? ?au va? ?au tr?m tr?ng h?n.  Qu v? b? s?t.  Qu v? khng ?i ??i ti?n sau 4 ngy.  Qu v? nn.  Qu v? khng ?i.  Qu v? b? s?t cn.  Qu v? b? ch?y mu ? h?u mn.  Phn c?a qu v? m?ng, trng nh? bt ch.  NGAY L?P T?C ?I KHM N?U:  Qu v? b? s?t v cc tri?u ch?ng c?a qu v? ??t nhin tr?m tr?ng h?n.  Qu v? sn phn ho?c c mu trong phn.  B?ng c?a qu v? b? ch??ng ln.  Qu v? b? ?au r?t nhi?u ? b?ng.  Qu v? c?m th?y chng m?t ho?c ng?t x?u.  Thng tin ny khng nh?m m?c ?ch thay th? cho l?i khuyn m chuyn gia ch?m [place] s?c kh?e ni v?i qu v?. Hy b?o ??m qu v? ph?i th?o lu?n b?t k? v?n ?? g m qu  v? c v?i chuyn gia ch?m [place] s?c kh?e c?a qu v?. Document Released: 09/30/2010 Document Revised: 07/06/2014 Document Reviewed: 12/04/2015 Elsevier Interactive Patient Education  2017 [employer].

## 2017-02-19 LAB — H. PYLORI BREATH TEST: H pylori Breath Test: POSITIVE — AB

## 2017-02-23 ENCOUNTER — Telehealth: Payer: Self-pay | Admitting: Emergency Medicine

## 2017-02-23 NOTE — Telephone Encounter (Signed)
Let a message to return call via pacific interpretor line.

## 2017-08-03 ENCOUNTER — Ambulatory Visit: Payer: BLUE CROSS/BLUE SHIELD | Admitting: Family Medicine

## 2017-08-04 ENCOUNTER — Encounter: Payer: Self-pay | Admitting: Emergency Medicine

## 2017-08-04 ENCOUNTER — Ambulatory Visit (INDEPENDENT_AMBULATORY_CARE_PROVIDER_SITE_OTHER): Payer: BLUE CROSS/BLUE SHIELD

## 2017-08-04 ENCOUNTER — Other Ambulatory Visit: Payer: Self-pay

## 2017-08-04 ENCOUNTER — Ambulatory Visit: Payer: BLUE CROSS/BLUE SHIELD | Admitting: Emergency Medicine

## 2017-08-04 VITALS — BP 94/64 | HR 84 | Temp 98.2°F | Resp 16 | Ht 64.0 in | Wt 101.0 lb

## 2017-08-04 DIAGNOSIS — G8929 Other chronic pain: Secondary | ICD-10-CM | POA: Insufficient documentation

## 2017-08-04 DIAGNOSIS — M549 Dorsalgia, unspecified: Secondary | ICD-10-CM | POA: Diagnosis not present

## 2017-08-04 DIAGNOSIS — R51 Headache: Secondary | ICD-10-CM

## 2017-08-04 DIAGNOSIS — R519 Headache, unspecified: Secondary | ICD-10-CM | POA: Insufficient documentation

## 2017-08-04 LAB — POCT CBC
GRANULOCYTE PERCENT: 53.6 % (ref 37–80)
HCT, POC: 42.6 % (ref 37.7–47.9)
HEMOGLOBIN: 13.9 g/dL (ref 12.2–16.2)
Lymph, poc: 1.7 (ref 0.6–3.4)
MCH: 28.7 pg (ref 27–31.2)
MCHC: 32.6 g/dL (ref 31.8–35.4)
MCV: 87.9 fL (ref 80–97)
MID (CBC): 0.2 (ref 0–0.9)
MPV: 7.6 fL (ref 0–99.8)
PLATELET COUNT, POC: 259 10*3/uL (ref 142–424)
POC Granulocyte: 2.3 (ref 2–6.9)
POC LYMPH PERCENT: 41.2 %L (ref 10–50)
POC MID %: 5.2 % (ref 0–12)
RBC: 4.85 M/uL (ref 4.04–5.48)
RDW, POC: 13.1 %
WBC: 4.2 10*3/uL — AB (ref 4.6–10.2)

## 2017-08-04 LAB — POCT URINALYSIS DIP (MANUAL ENTRY)
BILIRUBIN UA: NEGATIVE mg/dL
Bilirubin, UA: NEGATIVE
Blood, UA: NEGATIVE
GLUCOSE UA: NEGATIVE mg/dL
Nitrite, UA: NEGATIVE
PROTEIN UA: NEGATIVE mg/dL
SPEC GRAV UA: 1.015 (ref 1.010–1.025)
Urobilinogen, UA: 0.2 E.U./dL
pH, UA: 8.5 — AB (ref 5.0–8.0)

## 2017-08-04 LAB — POCT URINE PREGNANCY: Preg Test, Ur: NEGATIVE

## 2017-08-04 MED ORDER — DICLOFENAC SODIUM 75 MG PO TBEC: 75.0000 mg | DELAYED_RELEASE_TABLET | Freq: Two times a day (BID) | ORAL | 0 refills | Status: AC

## 2017-08-04 NOTE — Progress Notes (Signed)
Erika Alvarez Jan 21 y.o.   Chief Complaint  Patient presents with  . Back Pain    lower  per patient everyday  . Headache    x 3 weeks    HISTORY OF PRESENT ILLNESS: This is a 22 y.o. female complaining of low back pain that started many months ago.  Denies injury.  Denies urinary symptoms.  Denies abdominal pain.  Denies nausea or vomiting.  Also complaining of intermittent headaches for 3 weeks.  No other significant symptoms.  No significant past medical history.  HPI   Prior to Admission medications   Medication Sig Start Date End Date Taking? Authorizing Provider  fluticasone (FLONASE) 50 MCG/ACT nasal spray Place 2 sprays into both nostrils daily. 02/18/17  Yes Myles Lipps, MD  Prenatal Vit w/Fe-Methylfol-FA (PNV PO) Take 1 tablet by mouth daily.     [provider]    Allergies  Allergen Reactions  . Acetaminophen Itching and Rash    Patient Active Problem List   Diagnosis Date Noted  . Language barrier 05/27/2016    Past Medical History:  Diagnosis Date  . Allergy   . Constipation   . Tendonitis of left hip     No past surgical history on file.  Social History   Socioeconomic History  . Marital status: Married    Spouse name: Not on file  . Number of children: Not on file  . Years of education: Not on file  . Highest education level: Not on file  Social Needs  . Financial resource strain: Not on file  . Food insecurity - worry: Not on file  . Food insecurity - inability: Not on file  . Transportation needs - medical: Not on file  . Transportation needs - non-medical: Not on file  Occupational History  . Not on file  Tobacco Use  . Smoking status: Never Smoker  . Smokeless tobacco: Never Used  Substance and Sexual Activity  . Alcohol use: No  . Drug use: No  . Sexual activity: Yes  Other Topics Concern  . Not on file  Social History Narrative  . Not on file    Family History  Problem Relation Age of Onset  . Cirrhosis Mother    . Colitis Father      Review of Systems  Constitutional: Negative for chills, fever and weight loss.  HENT: Negative.  Negative for sore throat.   Eyes: Negative.   Respiratory: Negative.  Negative for cough and shortness of breath.   Cardiovascular: Negative.  Negative for chest pain and palpitations.  Gastrointestinal: Negative.  Negative for abdominal pain, blood in stool, diarrhea, nausea and vomiting.  Genitourinary: Negative.  Negative for dysuria, flank pain, frequency, hematuria and urgency.  Musculoskeletal: Positive for back pain.  Skin: Negative.  Negative for rash.  Neurological: Positive for headaches. Negative for dizziness.  Endo/Heme/Allergies: Negative.   All other systems reviewed and are negative.   Vitals:   08/04/17 0820  BP: 94/64  Pulse: 84  Resp: 16  Temp: 98.2 F (36.8 C)  SpO2: 99%    Physical Exam  Constitutional: She is oriented to person, place, and time. She appears well-developed and well-nourished.  HENT:  Head: Normocephalic and atraumatic.  Right Ear: External ear normal.  Left Ear: External ear normal.  Nose: Nose normal.  Mouth/Throat: Oropharynx is clear and moist.  Eyes: Conjunctivae and EOM are normal. Pupils are equal, round, and reactive to light.  Neck: Normal range of motion. Neck supple.  No JVD present.  Cardiovascular: Normal rate, regular rhythm and normal heart sounds.  Pulmonary/Chest: Effort normal and breath sounds normal.  Abdominal: Soft. Bowel sounds are normal. She exhibits no distension. There is no tenderness.  Musculoskeletal: Normal range of motion. She exhibits no edema or tenderness.  Lymphadenopathy:    She has no cervical adenopathy.  Neurological: She is alert and oriented to person, place, and time. She displays normal reflexes. No cranial nerve deficit or sensory deficit. She exhibits normal muscle tone. Coordination normal.  CRANIAL NERVE:  2nd - no papilledema on fundoscopic exam 2nd, 3rd, 4th, 6th -  pupils equal and reactive to light, visual fields full to confrontation, extraocular muscles intact, no nystagmus 5th - facial sensation symmetric 7th - facial strength symmetric 8th - hearing intact 9th - palate elevates symmetrically, uvula midline 11th - shoulder shrug symmetric 12th - tongue protrusion midline    Skin: Skin is warm and dry. Capillary refill takes less than 2 seconds. No rash noted.  Psychiatric: She has a normal mood and affect. Her behavior is normal.  Vitals reviewed.  Results for orders placed or performed in visit on 08/04/17 (from the past 24 hour(s))  POCT urine pregnancy     Status: None   Collection Time: 08/04/17  8:44 AM  Result Value Ref Range   Preg Test, Ur Negative Negative  POCT urinalysis dipstick     Status: Abnormal   Collection Time: 08/04/17  8:45 AM  Result Value Ref Range   Color, UA yellow yellow   Clarity, UA clear clear   Glucose, UA negative negative mg/dL   Bilirubin, UA negative negative   Ketones, POC UA negative negative mg/dL   Spec Grav, UA 1.610 9.604 - 1.025   Blood, UA negative negative   pH, UA 8.5 (A) 5.0 - 8.0   Protein Ur, POC negative negative mg/dL   Urobilinogen, UA 0.2 0.2 or 1.0 E.U./dL   Nitrite, UA Negative Negative   Leukocytes, UA Trace (A) Negative  POCT CBC     Status: Abnormal   Collection Time: 08/04/17  8:46 AM  Result Value Ref Range   WBC 4.2 (A) 4.6 - 10.2 K/uL   Lymph, poc 1.7 0.6 - 3.4   POC LYMPH PERCENT 41.2 10 - 50 %L   MID (cbc) 0.2 0 - 0.9   POC MID % 5.2 0 - 12 %M   POC Granulocyte 2.3 2 - 6.9   Granulocyte percent 53.6 37 - 80 %G   RBC 4.85 4.04 - 5.48 M/uL   Hemoglobin 13.9 12.2 - 16.2 g/dL   HCT, POC 54.0 98.1 - 47.9 %   MCV 87.9 80 - 97 fL   MCH, POC 28.7 27 - 31.2 pg   MCHC 32.6 31.8 - 35.4 g/dL   RDW, POC 19.1 %   Platelet Count, POC 259 142 - 424 K/uL   MPV 7.6 0 - 99.8 fL   Dg Lumbar Spine 2-3 Views  Result Date: 08/04/2017 CLINICAL DATA:  Low back pain.  No known injury.  EXAM: LUMBAR SPINE - 2-3 VIEW COMPARISON:  None FINDINGS: There is no evidence of lumbar spine fracture. Alignment is normal. Intervertebral disc spaces are maintained. IMPRESSION: Negative. Electronically Signed   By: Signa Kell M.D.   On: 08/04/2017 09:18     ASSESSMENT & PLAN: Erika Alvarez was seen today for back pain and headache.  Diagnoses and all orders for this visit:  Acute back pain, unspecified back location, unspecified back pain  laterality -     POCT urinalysis dipstick -     POCT CBC -     POCT urine pregnancy -     DG Lumbar Spine 2-3 Views; Future -     Urine Culture -     diclofenac (VOLTAREN) 75 MG EC tablet; Take 1 tablet (75 mg total) by mouth 2 (two) times daily for 5 days.  Chronic nonintractable headache, unspecified headache type -     diclofenac (VOLTAREN) 75 MG EC tablet; Take 1 tablet (75 mg total) by mouth 2 (two) times daily for 5 days.    Patient Instructions       IF you received an x-ray today, you will receive an invoice from [facility]. Please contact [facility] at [phone] with questions or concerns regarding your invoice.   IF you received labwork today, you will receive an invoice from [facility]. Please contact [facility] at [phone] with questions or concerns regarding your invoice.   Our billing staff will not be able to assist you with questions regarding bills from these companies.  You will be contacted with the lab results as soon as they are available. The fastest way to get your results is to activate your My Chart account. Instructions are located on the last page of this paperwork. If you have not heard from [place] regarding the results in 2 weeks, please contact this office.     General Headache Without Cause A headache is pain or discomfort felt around the head or neck area. There are many causes and types of headaches. In some cases, the cause may not be found. Follow these instructions at home: Managing  pain  Take over-the-counter and prescription medicines only as told by your doctor.  Lie down in a dark, quiet room when you have a headache.  If directed, apply ice to the head and neck area: ? Put ice in a plastic bag. ? Place a towel between your skin and the bag. ? Leave the ice on for 20 minutes, 2-3 times per day.  Use a heating pad or hot shower to apply heat to the head and neck area as told by your doctor.  Keep lights dim if bright lights bother you or make your headaches worse. Eating and drinking  Eat meals on a regular schedule.  Lessen how much alcohol you drink.  Lessen how much caffeine you drink, or stop drinking caffeine. General instructions  Keep all follow-up visits as told by your doctor. This is important.  Keep a journal to find out if certain things bring on headaches. For example, write down: ? What you eat and drink. ? How much sleep you get. ? Any change to your diet or medicines.  Relax by getting a massage or doing other relaxing activities.  Lessen stress.  Sit up straight. Do not tighten (tense) your muscles.  Do not use tobacco products. This includes cigarettes, chewing tobacco, or e-cigarettes. If you need help quitting, ask your doctor.  Exercise regularly as told by your doctor.  Get enough sleep. This often means 7-9 hours of sleep. Contact a doctor if:  Your symptoms are not helped by medicine.  You have a headache that feels different than the other headaches.  You feel sick to your stomach (nauseous) or you throw up (vomit).  You have a fever. Get help right away if:  Your headache becomes really bad.  You keep throwing up.  You have a stiff neck.  You have trouble seeing.  You have trouble speaking.  You have pain in the eye or ear.  Your muscles are weak or you lose muscle control.  You lose your balance or have trouble walking.  You feel like you will pass out (faint) or you pass out.  You have  confusion. This information is not intended to replace advice given to you by your health care provider. Make sure you discuss any questions you have with your health care provider. Document Released: 03/24/2008 Document Revised: 11/21/2015 Document Reviewed: 10/08/2014 Elsevier Interactive Patient Education  2018 [employer]vier Inc.  Back Pain, Adult Back pain is very common. The pain often gets better over time. The cause of back pain is usually not dangerous. Most people can learn to manage their back pain on their own. Follow these instructions at home: Watch your back pain for any changes. The following actions may help to lessen any pain you are feeling:  Stay active. Start with short walks on flat ground if you can. Try to walk farther each day.  Exercise regularly as told by your doctor. Exercise helps your back heal faster. It also helps avoid future injury by keeping your muscles strong and flexible.  Do not sit, drive, or stand in one place for more than 30 minutes.  Do not stay in bed. Resting more than 1-2 days can slow down your recovery.  Be careful when you bend or lift an object. Use good form when lifting: ? Bend at your knees. ? Keep the object close to your body. ? Do not twist.  Sleep on a firm mattress. Lie on your side, and bend your knees. If you lie on your back, put a pillow under your knees.  Take medicines only as told by your doctor.  Put ice on the injured area. ? Put ice in a plastic bag. ? Place a towel between your skin and the bag. ? Leave the ice on for 20 minutes, 2-3 times a day for the first 2-3 days. After that, you can switch between ice and heat packs.  Avoid feeling anxious or stressed. Find good ways to deal with stress, such as exercise.  Maintain a healthy weight. Extra weight puts stress on your back.  Contact a doctor if:  You have pain that does not go away with rest or medicine.  You have worsening pain that goes down into your legs  or buttocks.  You have pain that does not get better in one week.  You have pain at night.  You lose weight.  You have a fever or chills. Get help right away if:  You cannot control when you poop (bowel movement) or pee (urinate).  Your arms or legs feel weak.  Your arms or legs lose feeling (numbness).  You feel sick to your stomach (nauseous) or throw up (vomit).  You have belly (abdominal) pain.  You feel like you may pass out (faint). This information is not intended to replace advice given to you by your health care provider. Make sure you discuss any questions you have with your health care provider. Document Released: 12/02/2007 Document Revised: 11/21/2015 Document Reviewed: 10/17/2013 Elsevier Interactive Patient Education  2018 Elsevier Inc.      Edwina BarthMiguel Kathreen Dileo, MD [facility]

## 2017-08-04 NOTE — Patient Instructions (Addendum)
IF you received an x-ray today, you will receive an invoice from [facility]. Please contact [facility] at [phone] with questions or concerns regarding your invoice.   IF you received labwork today, you will receive an invoice from [facility]. Please contact [facility] at [phone] with questions or concerns regarding your invoice.   Our billing staff will not be able to assist you with questions regarding bills from these companies.  You will be contacted with the lab results as soon as they are available. The fastest way to get your results is to activate your My Chart account. Instructions are located on the last page of this paperwork. If you have not heard from us regarding the results in 2 weeks, please contact this office.     General Headache Without Cause A headache is pain or discomfort felt around the head or neck area. There are many causes and types of headaches. In some cases, the cause may not be found. Follow these instructions at home: Managing pain  Take over-the-counter and prescription medicines only as told by your doctor.  Lie down in a dark, quiet room when you have a headache.  If directed, apply ice to the head and neck area: ? Put ice in a plastic bag. ? Place a towel between your skin and the bag. ? Leave the ice on for 20 minutes, 2-3 times per day.  Use a heating pad or hot shower to apply heat to the head and neck area as told by your doctor.  Keep lights dim if bright lights bother you or make your headaches worse. Eating and drinking  Eat meals on a regular schedule.  Lessen how much alcohol you drink.  Lessen how much caffeine you drink, or stop drinking caffeine. General instructions  Keep all follow-up visits as told by your doctor. This is important.  Keep a journal to find out if certain things bring on headaches. For example, write down: ? What you eat and drink. ? How much sleep you get. ? Any change to  your diet or medicines.  Relax by getting a massage or doing other relaxing activities.  Lessen stress.  Sit up straight. Do not tighten (tense) your muscles.  Do not use tobacco products. This includes cigarettes, chewing tobacco, or e-cigarettes. If you need help quitting, ask your doctor.  Exercise regularly as told by your doctor.  Get enough sleep. This often means 7-9 hours of sleep. Contact a doctor if:  Your symptoms are not helped by medicine.  You have a headache that feels different than the other headaches.  You feel sick to your stomach (nauseous) or you throw up (vomit).  You have a fever. Get help right away if:  Your headache becomes really bad.  You keep throwing up.  You have a stiff neck.  You have trouble seeing.  You have trouble speaking.  You have pain in the eye or ear.  Your muscles are weak or you lose muscle control.  You lose your balance or have trouble walking.  You feel like you will pass out (faint) or you pass out.  You have confusion. This information is not intended to replace advice given to you by your health care provider. Make sure you discuss any questions you have with your health care provider. Document Released: 03/24/2008 Document Revised: 11/21/2015 Document Reviewed: 10/08/2014 Elsevier Interactive Patient Education  2018 Elsevier Inc.  Back Pain, Adult Back pain is very common. The pain often gets  better over time. The cause of back pain is usually not dangerous. Most people can learn to manage their back pain on their own. Follow these instructions at home: Watch your back pain for any changes. The following actions may help to lessen any pain you are feeling:  Stay active. Start with short walks on flat ground if you can. Try to walk farther each day.  Exercise regularly as told by your doctor. Exercise helps your back heal faster. It also helps avoid future injury by keeping your muscles strong and  flexible.  Do not sit, drive, or stand in one place for more than 30 minutes.  Do not stay in bed. Resting more than 1-2 days can slow down your recovery.  Be careful when you bend or lift an object. Use good form when lifting: ? Bend at your knees. ? Keep the object close to your body. ? Do not twist.  Sleep on a firm mattress. Lie on your side, and bend your knees. If you lie on your back, put a pillow under your knees.  Take medicines only as told by your doctor.  Put ice on the injured area. ? Put ice in a plastic bag. ? Place a towel between your skin and the bag. ? Leave the ice on for 20 minutes, 2-3 times a day for the first 2-3 days. After that, you can switch between ice and heat packs.  Avoid feeling anxious or stressed. Find good ways to deal with stress, such as exercise.  Maintain a healthy weight. Extra weight puts stress on your back.  Contact a doctor if:  You have pain that does not go away with rest or medicine.  You have worsening pain that goes down into your legs or buttocks.  You have pain that does not get better in one week.  You have pain at night.  You lose weight.  You have a fever or chills. Get help right away if:  You cannot control when you poop (bowel movement) or pee (urinate).  Your arms or legs feel weak.  Your arms or legs lose feeling (numbness).  You feel sick to your stomach (nauseous) or throw up (vomit).  You have belly (abdominal) pain.  You feel like you may pass out (faint). This information is not intended to replace advice given to you by your health care provider. Make sure you discuss any questions you have with your health care provider. Document Released: 12/02/2007 Document Revised: 11/21/2015 Document Reviewed: 10/17/2013 Elsevier Interactive Patient Education  [employer].

## 2017-08-05 LAB — URINE CULTURE: ORGANISM ID, BACTERIA: NO GROWTH

## 2017-08-06 ENCOUNTER — Encounter: Payer: Self-pay | Admitting: Radiology

## 2017-09-29 ENCOUNTER — Encounter: Payer: Self-pay | Admitting: Physician Assistant

## 2018-03-14 IMAGING — DX DG LUMBAR SPINE 2-3V
3 series · 3 of 3 positions shown · non-contrast
Comparison: None

CLINICAL DATA: Low back pain.  No known injury.

EXAM:
LUMBAR SPINE - 2-3 VIEW

[l-spine ap]
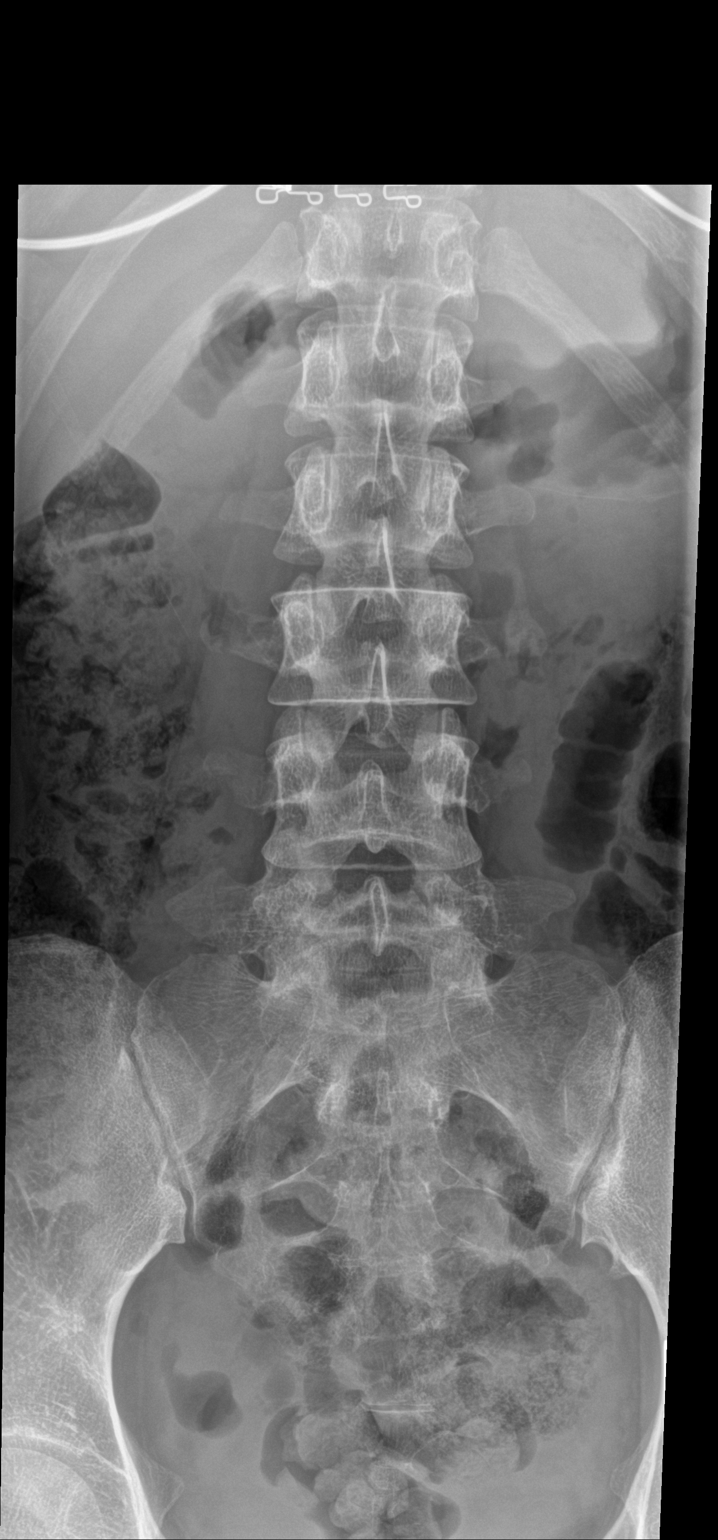

[l-spine lat]
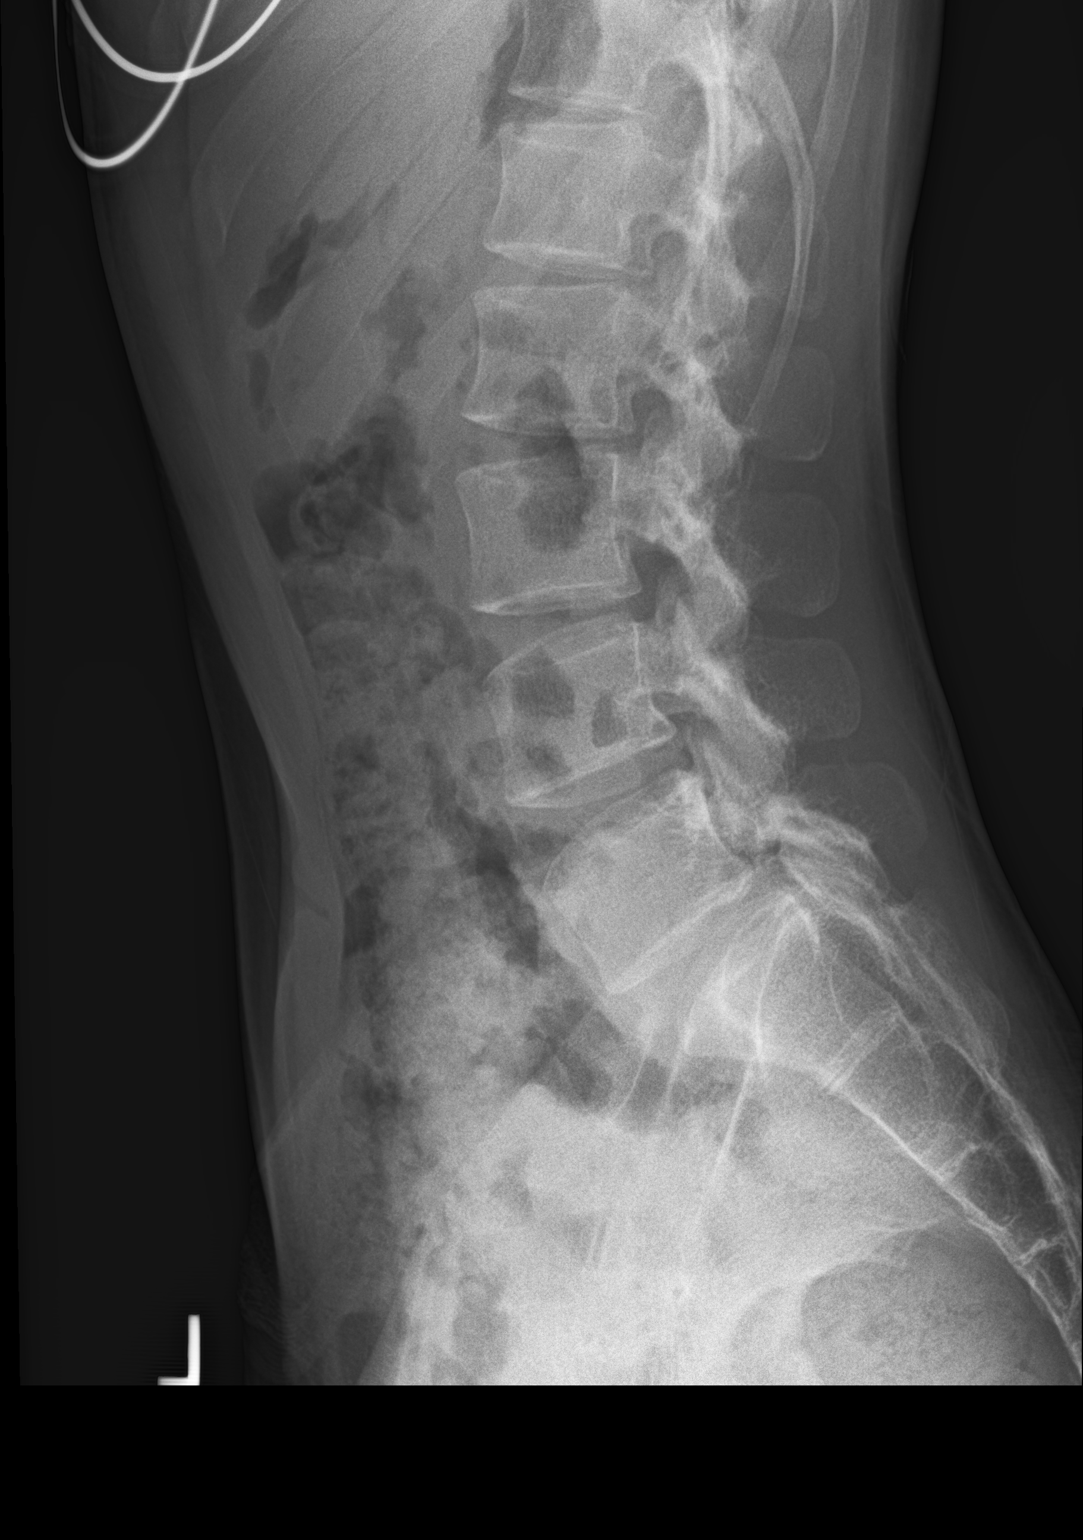

[l-spine l5-s1]
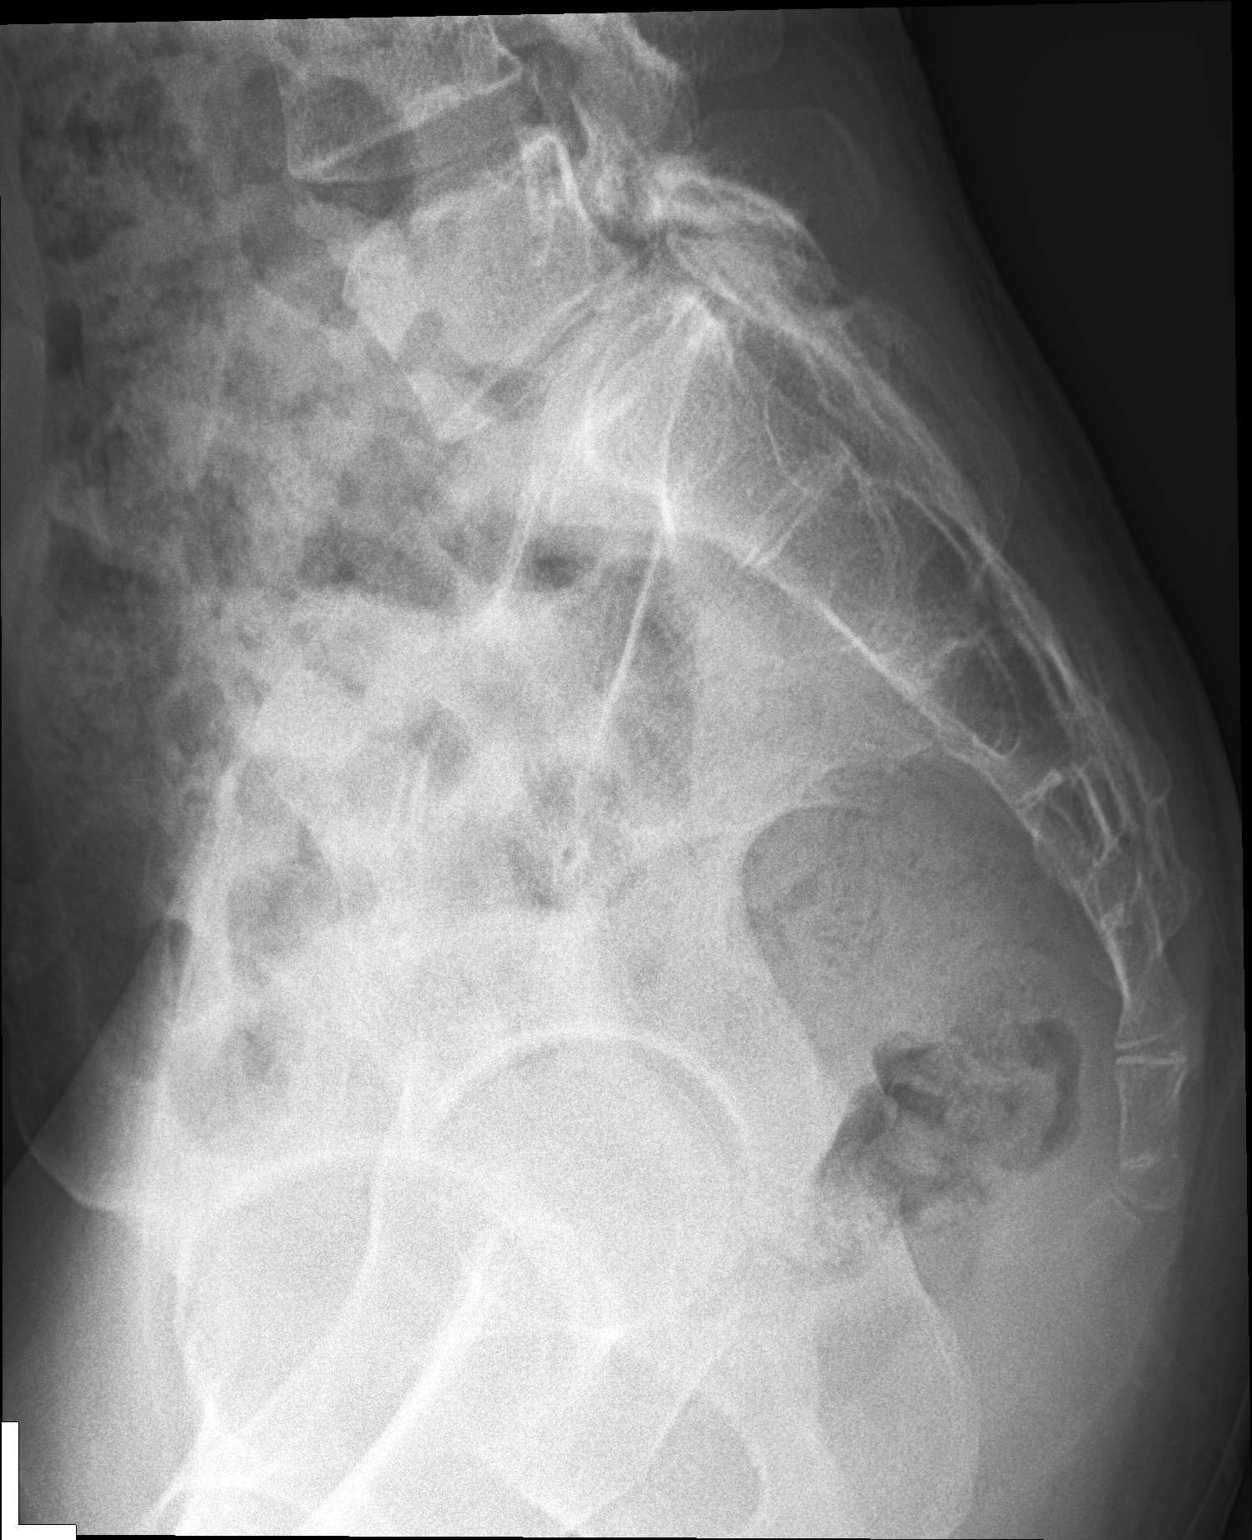

[3 of 3 positions shown; findings below may reference images not displayed]

FINDINGS: There is no evidence of lumbar spine fracture. Alignment is normal.
Intervertebral disc spaces are maintained.
IMPRESSION: Negative.
